# Patient Record
Sex: Female | Born: 1935 | Race: White | Hispanic: No | Smoking: Never smoker
Health system: Southern US, Community
[De-identification: ages and names within clinical notes are randomized; demographics above are authoritative.]

## PROBLEM LIST (undated history)

## (undated) DIAGNOSIS — F0390 Unspecified dementia without behavioral disturbance: Secondary | ICD-10-CM

## (undated) DIAGNOSIS — F329 Major depressive disorder, single episode, unspecified: Secondary | ICD-10-CM

## (undated) DIAGNOSIS — E079 Disorder of thyroid, unspecified: Secondary | ICD-10-CM

## (undated) DIAGNOSIS — K219 Gastro-esophageal reflux disease without esophagitis: Secondary | ICD-10-CM

## (undated) DIAGNOSIS — F32A Depression, unspecified: Secondary | ICD-10-CM

## (undated) DIAGNOSIS — K5900 Constipation, unspecified: Secondary | ICD-10-CM

---

## 1978-04-14 HISTORY — PX: ABDOMINAL HYSTERECTOMY: SHX81

## 1988-04-14 HISTORY — PX: CHOLECYSTECTOMY: SHX55

## 2004-03-29 ENCOUNTER — Emergency Department: Payer: Self-pay | Admitting: Emergency Medicine

## 2004-03-29 ENCOUNTER — Ambulatory Visit: Payer: Self-pay | Admitting: Internal Medicine

## 2004-05-02 ENCOUNTER — Ambulatory Visit: Payer: Self-pay | Admitting: Gastroenterology

## 2004-05-20 ENCOUNTER — Ambulatory Visit: Payer: Self-pay | Admitting: Gastroenterology

## 2004-07-08 ENCOUNTER — Ambulatory Visit: Payer: Self-pay | Admitting: Internal Medicine

## 2004-12-17 ENCOUNTER — Ambulatory Visit: Payer: Self-pay | Admitting: Internal Medicine

## 2005-09-16 ENCOUNTER — Ambulatory Visit: Payer: Self-pay | Admitting: Gastroenterology

## 2005-10-21 ENCOUNTER — Ambulatory Visit: Payer: Self-pay

## 2005-12-01 ENCOUNTER — Ambulatory Visit: Payer: Self-pay

## 2005-12-13 ENCOUNTER — Ambulatory Visit: Payer: Self-pay

## 2006-01-12 ENCOUNTER — Ambulatory Visit: Payer: Self-pay

## 2006-02-12 ENCOUNTER — Ambulatory Visit: Payer: Self-pay

## 2006-03-10 ENCOUNTER — Ambulatory Visit: Payer: Self-pay | Admitting: Internal Medicine

## 2006-04-23 ENCOUNTER — Ambulatory Visit: Payer: Self-pay | Admitting: Psychiatry

## 2007-06-02 ENCOUNTER — Ambulatory Visit: Payer: Self-pay | Admitting: Internal Medicine

## 2007-11-03 DIAGNOSIS — E7800 Pure hypercholesterolemia, unspecified: Secondary | ICD-10-CM

## 2007-11-03 DIAGNOSIS — E039 Hypothyroidism, unspecified: Secondary | ICD-10-CM

## 2007-11-03 DIAGNOSIS — F329 Major depressive disorder, single episode, unspecified: Secondary | ICD-10-CM

## 2007-11-03 DIAGNOSIS — K219 Gastro-esophageal reflux disease without esophagitis: Secondary | ICD-10-CM

## 2008-01-19 DIAGNOSIS — G4700 Insomnia, unspecified: Secondary | ICD-10-CM

## 2008-01-21 ENCOUNTER — Ambulatory Visit: Payer: Self-pay | Admitting: Family Medicine

## 2008-01-28 ENCOUNTER — Emergency Department: Payer: Self-pay | Admitting: Emergency Medicine

## 2008-08-10 ENCOUNTER — Ambulatory Visit: Payer: Self-pay | Admitting: Family Medicine

## 2008-10-02 DIAGNOSIS — E539 Vitamin B deficiency, unspecified: Secondary | ICD-10-CM

## 2009-01-31 DIAGNOSIS — E559 Vitamin D deficiency, unspecified: Secondary | ICD-10-CM

## 2009-08-14 ENCOUNTER — Ambulatory Visit: Payer: Self-pay | Admitting: Family Medicine

## 2010-09-12 ENCOUNTER — Ambulatory Visit: Payer: Self-pay | Admitting: Family Medicine

## 2013-09-01 LAB — URINALYSIS, COMPLETE
BILIRUBIN, UR: NEGATIVE
BLOOD: NEGATIVE
Bacteria: NONE SEEN
GLUCOSE, UR: NEGATIVE mg/dL (ref 0–75)
Ketone: NEGATIVE
LEUKOCYTE ESTERASE: NEGATIVE
Nitrite: NEGATIVE
PH: 7 (ref 4.5–8.0)
PROTEIN: NEGATIVE
SQUAMOUS EPITHELIAL: NONE SEEN
Specific Gravity: 1.011 (ref 1.003–1.030)
WBC UR: <1 /HPF (ref 0–5)

## 2013-09-01 LAB — COMPREHENSIVE METABOLIC PANEL
ALBUMIN: 3.4 g/dL (ref 3.4–5.0)
Alkaline Phosphatase: 68 U/L
Anion Gap: 9 (ref 7–16)
BUN: 10 mg/dL (ref 7–18)
Bilirubin,Total: 0.7 mg/dL (ref 0.2–1.0)
CALCIUM: 8.6 mg/dL (ref 8.5–10.1)
CO2: 27 mmol/L (ref 21–32)
Chloride: 103 mmol/L (ref 98–107)
Creatinine: 0.71 mg/dL (ref 0.60–1.30)
EGFR (Non-African Amer.): >60
Glucose: 108 mg/dL — ABNORMAL HIGH (ref 65–99)
OSMOLALITY: 277 (ref 275–301)
Potassium: 4.2 mmol/L (ref 3.5–5.1)
SGOT(AST): 39 U/L — ABNORMAL HIGH (ref 15–37)
SGPT (ALT): 38 U/L (ref 12–78)
SODIUM: 139 mmol/L (ref 136–145)
TOTAL PROTEIN: 6.8 g/dL (ref 6.4–8.2)

## 2013-09-01 LAB — DRUG SCREEN, URINE
AMPHETAMINES, UR SCREEN: NEGATIVE (ref ?–1000)
BENZODIAZEPINE, UR SCRN: NEGATIVE (ref ?–200)
Barbiturates, Ur Screen: NEGATIVE (ref ?–200)
CANNABINOID 50 NG, UR ~~LOC~~: NEGATIVE (ref ?–50)
COCAINE METABOLITE, UR ~~LOC~~: NEGATIVE (ref ?–300)
MDMA (Ecstasy)Ur Screen: NEGATIVE (ref ?–500)
METHADONE, UR SCREEN: NEGATIVE (ref ?–300)
OPIATE, UR SCREEN: NEGATIVE (ref ?–300)
Phencyclidine (PCP) Ur S: NEGATIVE (ref ?–25)
TRICYCLIC, UR SCREEN: NEGATIVE (ref ?–1000)

## 2013-09-01 LAB — ETHANOL: Ethanol %: <0.003 % (ref 0.000–0.080)

## 2013-09-01 LAB — CBC
HCT: 38.7 % (ref 35.0–47.0)
HGB: 12.9 g/dL (ref 12.0–16.0)
MCH: 32.7 pg (ref 26.0–34.0)
MCHC: 33.4 g/dL (ref 32.0–36.0)
MCV: 98 fL (ref 80–100)
Platelet: 184 10*3/uL (ref 150–440)
RBC: 3.94 10*6/uL (ref 3.80–5.20)
RDW: 12.9 % (ref 11.5–14.5)
WBC: 12.6 10*3/uL — ABNORMAL HIGH (ref 3.6–11.0)

## 2013-09-01 LAB — TROPONIN I: Troponin-I: <0.02 ng/mL

## 2013-09-02 LAB — COMPREHENSIVE METABOLIC PANEL
ALK PHOS: 57 U/L
ALT: 37 U/L (ref 12–78)
AST: 40 U/L — AB (ref 15–37)
Albumin: 3 g/dL — ABNORMAL LOW (ref 3.4–5.0)
Anion Gap: 7 (ref 7–16)
BUN: 13 mg/dL (ref 7–18)
Bilirubin,Total: 1 mg/dL (ref 0.2–1.0)
Calcium, Total: 8.5 mg/dL (ref 8.5–10.1)
Chloride: 104 mmol/L (ref 98–107)
Co2: 27 mmol/L (ref 21–32)
Creatinine: 0.79 mg/dL (ref 0.60–1.30)
EGFR (Non-African Amer.): >60
Glucose: 119 mg/dL — ABNORMAL HIGH (ref 65–99)
OSMOLALITY: 277 (ref 275–301)
Potassium: 3.9 mmol/L (ref 3.5–5.1)
Sodium: 138 mmol/L (ref 136–145)
Total Protein: 6.2 g/dL — ABNORMAL LOW (ref 6.4–8.2)

## 2013-09-02 LAB — CBC WITH DIFFERENTIAL/PLATELET
Basophil #: 0.1 10*3/uL (ref 0.0–0.1)
Basophil %: 0.5 %
EOS ABS: 0.3 10*3/uL (ref 0.0–0.7)
Eosinophil %: 2.4 %
HCT: 36.4 % (ref 35.0–47.0)
HGB: 11.7 g/dL — ABNORMAL LOW (ref 12.0–16.0)
Lymphocyte #: 1.5 10*3/uL (ref 1.0–3.6)
Lymphocyte %: 11.7 %
MCH: 31.5 pg (ref 26.0–34.0)
MCHC: 32.2 g/dL (ref 32.0–36.0)
MCV: 98 fL (ref 80–100)
MONOS PCT: 6 %
Monocyte #: 0.7 x10 3/mm (ref 0.2–0.9)
Neutrophil #: 9.9 10*3/uL — ABNORMAL HIGH (ref 1.4–6.5)
Neutrophil %: 79.4 %
PLATELETS: 172 10*3/uL (ref 150–440)
RBC: 3.72 10*6/uL — ABNORMAL LOW (ref 3.80–5.20)
RDW: 12.9 % (ref 11.5–14.5)
WBC: 12.5 10*3/uL — ABNORMAL HIGH (ref 3.6–11.0)

## 2013-09-02 LAB — PROTIME-INR
INR: 1.1
Prothrombin Time: 14.2 secs (ref 11.5–14.7)

## 2013-09-02 LAB — APTT: Activated PTT: 32.6 secs (ref 23.6–35.9)

## 2013-09-02 LAB — AMYLASE: AMYLASE: 44 U/L (ref 25–115)

## 2013-09-03 ENCOUNTER — Inpatient Hospital Stay: Payer: Self-pay | Admitting: Surgery
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
## 2014-10-10 ENCOUNTER — Other Ambulatory Visit: Payer: Self-pay

## 2014-10-10 DIAGNOSIS — E538 Deficiency of other specified B group vitamins: Secondary | ICD-10-CM | POA: Insufficient documentation

## 2014-10-10 DIAGNOSIS — R7309 Other abnormal glucose: Secondary | ICD-10-CM | POA: Insufficient documentation

## 2014-10-20 ENCOUNTER — Other Ambulatory Visit: Payer: Self-pay | Admitting: Family Medicine

## 2014-10-20 DIAGNOSIS — K219 Gastro-esophageal reflux disease without esophagitis: Secondary | ICD-10-CM

## 2014-10-20 DIAGNOSIS — G4700 Insomnia, unspecified: Secondary | ICD-10-CM

## 2014-10-30 ENCOUNTER — Ambulatory Visit: Payer: Self-pay | Admitting: Family Medicine

## 2014-11-01 ENCOUNTER — Encounter: Payer: Self-pay | Admitting: Urgent Care

## 2014-11-01 ENCOUNTER — Emergency Department
Admission: EM | Admit: 2014-11-01 | Discharge: 2014-11-01 | Disposition: A | Discharge status: Home / Self Care | Payer: Medicare Other | Attending: Emergency Medicine | Admitting: Emergency Medicine

## 2014-11-01 ENCOUNTER — Emergency Department: Payer: Medicare Other

## 2014-11-01 DIAGNOSIS — Y998 Other external cause status: Secondary | ICD-10-CM | POA: Diagnosis not present

## 2014-11-01 DIAGNOSIS — S300XXA Contusion of lower back and pelvis, initial encounter: Secondary | ICD-10-CM | POA: Diagnosis not present

## 2014-11-01 DIAGNOSIS — Y92009 Unspecified place in unspecified non-institutional (private) residence as the place of occurrence of the external cause: Secondary | ICD-10-CM | POA: Insufficient documentation

## 2014-11-01 DIAGNOSIS — W010XXA Fall on same level from slipping, tripping and stumbling without subsequent striking against object, initial encounter: Secondary | ICD-10-CM | POA: Insufficient documentation

## 2014-11-01 DIAGNOSIS — W19XXXA Unspecified fall, initial encounter: Secondary | ICD-10-CM

## 2014-11-01 DIAGNOSIS — Y9389 Activity, other specified: Secondary | ICD-10-CM | POA: Insufficient documentation

## 2014-11-01 DIAGNOSIS — Z79899 Other long term (current) drug therapy: Secondary | ICD-10-CM | POA: Insufficient documentation

## 2014-11-01 DIAGNOSIS — S3992XA Unspecified injury of lower back, initial encounter: Secondary | ICD-10-CM | POA: Diagnosis present

## 2014-11-01 HISTORY — DX: Gastro-esophageal reflux disease without esophagitis: K21.9

## 2014-11-01 HISTORY — DX: Depression, unspecified: F32.A

## 2014-11-01 HISTORY — DX: Disorder of thyroid, unspecified: E07.9

## 2014-11-01 HISTORY — DX: Major depressive disorder, single episode, unspecified: F32.9

## 2014-11-01 HISTORY — DX: Constipation, unspecified: K59.00

## 2014-11-01 MED ORDER — TRAMADOL HCL 50 MG PO TABS
100.0000 mg | ORAL_TABLET | Freq: Once | ORAL | Status: AC
  Administered 2014-11-01: 100 mg via ORAL
  Filled 2014-11-01: qty 2

## 2014-11-01 MED ORDER — TRAMADOL HCL 50 MG PO TABS: 50.0000 mg | ORAL_TABLET | Freq: Two times a day (BID) | ORAL | Status: DC

## 2014-11-01 NOTE — ED Notes (Addendum)
Pt presents to ED with c/o bilateral hip pain d/t a fall on Monday of this week. Pt denies any radiation of pain into lower extremities; pt does reports some weakness bilaterally below the knees, but attributes that to "old age".

## 2014-11-01 NOTE — ED Provider Notes (Signed)
[facility] Emergency Department Provider Note ____________________________________________  Time seen: 2020  I have reviewed the triage vital signs and the nursing notes.  HISTORY  Chief Complaint  Fall and Back Pain  HPI Virginia Osborne is a 81 y.o. female with a witnessed mechanical fall, at home on Monday. She describes she was stepping to the threshold and her door, when the heel of her shoe got caught and broke, causingher to fall backwards on her buttocks. Her adult son was there to witness the fall and was able to pick her up. He denies, as does she, any head injury, loss of consciousness, or nausea. She complains of pain to her buttocks which is worsened with prolonged sitting and transitioning to standing. She denies any leg weakness, foot drop, incontinence, or subsequent fall.   Past Medical History  Diagnosis Date  . Thyroid disease   . GERD (gastroesophageal reflux disease)   . Depression   . Constipation     Patient Active Problem List   Diagnosis Date Noted  . Abnormal blood sugar 10/10/2014  . B12 deficiency 10/10/2014  . Avitaminosis D 01/31/2009  . Deficiency of vitamin B 10/02/2008  . Cannot sleep 01/19/2008  . Clinical depression 11/03/2007  . Acid reflux 11/03/2007  . Hypercholesteremia 11/03/2007  . Adult hypothyroidism 11/03/2007    Past Surgical History  Procedure Laterality Date  . Cholecystectomy  1990  . Abdominal hysterectomy  1980    total    Current Outpatient Rx  Name  Route  Sig  Dispense  Refill  . BIOFLAVONOID PRODUCTS PO   Oral   Take by mouth.         . Cholecalciferol (VITAMIN D) 2000 UNITS CAPS   Oral   Take by mouth.         . cyanocobalamin 1000 MCG tablet   Oral   Take 2 tablets by mouth daily.         . Ibuprofen-Diphenhydramine HCl (ADVIL PM) 200-25 MG CAPS   Oral   Take by mouth.         . levothyroxine (SYNTHROID, LEVOTHROID) 112 MCG tablet   Oral   Take by mouth.         .  MULTIPLE VITAMIN PO               . omeprazole (PRILOSEC) 20 MG capsule      TAKE ONE CAPSULE BY MOUTH ONCE DAILY   30 capsule   5   . omeprazole (PRILOSEC) 20 MG capsule      TAKE ONE CAPSULE BY MOUTH ONCE DAILY   30 capsule   5   . Omeprazole 20 MG TBEC   Oral   Take by mouth.         Marland Kitchen. PARoxetine (PAXIL) 30 MG tablet   Oral   Take by mouth.         Marland Kitchen. POLYETHYLENE GLYCOL 3350 PO   Oral   Take by mouth.         . traMADol (ULTRAM) 50 MG tablet   Oral   Take 1 tablet (50 mg total) by mouth 2 (two) times daily.   15 tablet   0   . traZODone (DESYREL) 150 MG tablet      TAKE TWO TABLETS BY MOUTH IN THE EVENING   60 tablet   5    Allergies Erythromycin  Family History  Problem Relation Age of Onset  . Diabetes Sister    Social History  History  Substance Use Topics  . Smoking status: Never Smoker   . Smokeless tobacco: Not on file  . Alcohol Use: Yes   Review of Systems  Constitutional: Negative for fever. Eyes: Negative for visual changes. ENT: Negative for sore throat. Cardiovascular: Negative for chest pain. Respiratory: Negative for shortness of breath. Gastrointestinal: Negative for abdominal pain, vomiting and diarrhea. Genitourinary: Negative for dysuria. Musculoskeletal: Positive for back pain. Skin: Negative for rash. Neurological: Negative for headaches, focal weakness or numbness. ____________________________________________  PHYSICAL EXAM:  VITAL SIGNS: ED Triage Vitals  Enc Vitals Group     BP 11/01/14 2004 130/58 mmHg     Pulse Rate 11/01/14 2004 88     Resp 11/01/14 2004 16     Temp 11/01/14 2004 97.2 F (36.2 C)     Temp Source 11/01/14 2004 Oral     SpO2 11/01/14 2004 93 %     Weight 11/01/14 2004 155 lb (70.308 kg)     Height 11/01/14 2004  (1.651 m)     Head Cir --      Peak Flow --      Pain Score 11/01/14 2005 10     Pain Loc --      Pain Edu? --      Excl. in GC? --    Constitutional: Alert and  oriented. Well appearing and in no distress. Eyes: Conjunctivae are normal. PERRL. Normal extraocular movements. ENT   Head: Normocephalic and atraumatic.   Nose: No congestion/rhinnorhea.   Mouth/Throat: Mucous membranes are moist.   Neck: Supple. No thyromegaly. Hematological/Lymphatic/Immunilogical: No cervical lymphadenopathy. Cardiovascular: Normal rate, regular rhythm.  Respiratory: Normal respiratory effort. No wheezes/rales/rhonchi. Gastrointestinal: Soft and nontender. No distention. Musculoskeletal: Normal spinal alignment without spasm, deformity, or step-off. No coccyx tenderness. Minimal tenderness to palp over the bilateral gluteal musculature.  Nontender with normal range of motion in all extremities. Normal sit-stand transition. Normal hip flexion. Neurologic:  Normal gait without ataxia. Normal speech and language. No gross focal neurologic deficits are appreciated. Normal LE DTRs bilaterally. Negative SLR bilaterally. Normal toe dorsiflexion.  Skin:  Skin is warm, dry and intact. No rash noted. CNII-XII grossly intact. Psychiatric: Mood and affect are normal. Patient exhibits appropriate insight and judgment. _____________________________   RADIOLOGY LS Spine FINDINGS: There is no evidence of lumbar spine fracture. Alignment is normal.  Moderate degenerative disc disease seen at L4-5 and L5-S1. Moderate facet DJD also seen at these levels. Generalized osteopenia noted. Atherosclerotic calcification of abdominal aorta noted.  I, Jaciel Diem, Charlesetta Ivory, personally viewed and evaluated these images as part of my medical decision making.  ____________________________________________  PROCEDURES  Tramadol 100 mg PO ____________________________________________  INITIAL IMPRESSION / ASSESSMENT AND PLAN / ED COURSE  Mechanical fall resulting in injury. Radiology results to patient and family. Treatment for lumbosacral contusion without neuromuscular  deficit.  Continue home Aleve BID along with prescription for Tramadol #15.  Follow-up with primary provider for ongoing symptoms.  ____________________________________________  FINAL CLINICAL IMPRESSION(S) / ED DIAGNOSES  Final diagnoses:  Fall at home, initial encounter  Contusion, buttock, initial encounter     Lissa Hoard, PA-C 11/01/14 2157  Phineas Semen, MD 11/02/14 [phone]

## 2014-11-01 NOTE — Discharge Instructions (Signed)
Contusion A contusion is a deep bruise. Contusions happen when an injury causes bleeding under the skin. Signs of bruising include pain, puffiness (swelling), and discolored skin. The contusion may turn blue, purple, or yellow. HOME CARE   Put ice on the injured area.  Put ice in a plastic bag.  Place a towel between your skin and the bag.  Leave the ice on for 15-20 minutes, 03-04 times a day.  Only take medicine as told by your doctor.  Rest the injured area.  If possible, raise (elevate) the injured area to lessen puffiness. GET HELP RIGHT AWAY IF:   You have more bruising or puffiness.  You have pain that is getting worse.  Your puffiness or pain is not helped by medicine. MAKE SURE YOU:   Understand these instructions.  Will watch your condition.  Will get help right away if you are not doing well or get worse. Document Released: 09/17/2007 Document Revised: 06/23/2011 Document Reviewed: 02/03/2011 [facility] Patient Information 2015 [place], [place]. This information is not intended to replace advice given to you by your health care provider. Make sure you discuss any questions you have with your health care provider.  Tailbone Injury The tailbone is the small bone at the lower end of the backbone (spine). You may have stretched tissues, bruises, or a broken bone (fracture). Most tailbone injuries get better on their own after 4 to 6 weeks. HOME CARE  Put ice on the injured area.  Put ice in a plastic bag.  Place a towel between your skin and the bag.  Leave the ice on for 15-20 minutes. Do this every hour while you are awake for 1 to 2 days.  Sit on a large, rubber or inflated ring or cushion to lessen pain. Lean forward when you sit to help lessen pain.  Avoid sitting in one place for a long time.  Increase your activity as the pain allows.  Only take medicines as told by your doctor.  You can take medicine to help you poop (stool softeners) if it is painful  to poop.  Eat foods with plenty of fiber.  Keep all doctor visits as told. GET HELP RIGHT AWAY IF:  Your pain gets worse.  Pooping causes you pain.  You cannot poop (constipation).  You have a fever. MAKE SURE YOU:  Understand these instructions.  Will watch your condition.  Will get help right away if you are not doing well or get worse. Document Released: 05/03/2010 Document Revised: 06/23/2011 Document Reviewed: 10/24/2010 [facility]Exit[facility] Patient Information 2015 [place], [place]. This information is not intended to replace advice given to you by your health care provider. Make sure you discuss any questions you have with your health care provider.  Your exam and x-rays are normal today following your fall at home.  You should continue to take your daily Aleve and the prescription Tramadol as needed.  Apply warm, moist compresses to reduce pain as needed. Follow-up with your provider as needed.

## 2014-11-01 NOTE — ED Notes (Signed)
Patient presents with c/o lower back pain s/p a fall on Monday of this week. Patient reporting that she tripped over her shoes. No other c/o pain at this time.

## 2014-11-05 ENCOUNTER — Emergency Department
Admission: EM | Admit: 2014-11-05 | Discharge: 2014-11-05 | Disposition: A | Discharge status: Home / Self Care | Payer: Medicare Other | Attending: Emergency Medicine | Admitting: Emergency Medicine

## 2014-11-05 ENCOUNTER — Other Ambulatory Visit: Payer: Self-pay

## 2014-11-05 ENCOUNTER — Emergency Department: Payer: Medicare Other

## 2014-11-05 DIAGNOSIS — Z79899 Other long term (current) drug therapy: Secondary | ICD-10-CM | POA: Insufficient documentation

## 2014-11-05 DIAGNOSIS — S20229D Contusion of unspecified back wall of thorax, subsequent encounter: Secondary | ICD-10-CM

## 2014-11-05 DIAGNOSIS — S300XXD Contusion of lower back and pelvis, subsequent encounter: Secondary | ICD-10-CM | POA: Diagnosis not present

## 2014-11-05 DIAGNOSIS — M545 Low back pain, unspecified: Secondary | ICD-10-CM

## 2014-11-05 DIAGNOSIS — W01198D Fall on same level from slipping, tripping and stumbling with subsequent striking against other object, subsequent encounter: Secondary | ICD-10-CM | POA: Insufficient documentation

## 2014-11-05 LAB — URINALYSIS COMPLETE WITH MICROSCOPIC (ARMC ONLY)
Bilirubin Urine: NEGATIVE
GLUCOSE, UA: NEGATIVE mg/dL
Hgb urine dipstick: NEGATIVE
Ketones, ur: NEGATIVE mg/dL
Leukocytes, UA: NEGATIVE
NITRITE: NEGATIVE
PROTEIN: NEGATIVE mg/dL
RBC / HPF: NONE SEEN RBC/hpf (ref 0–5)
SPECIFIC GRAVITY, URINE: 1.008 (ref 1.005–1.030)
pH: 7 (ref 5.0–8.0)

## 2014-11-05 LAB — COMPREHENSIVE METABOLIC PANEL
ALK PHOS: 74 U/L (ref 38–126)
ALT: 13 U/L — ABNORMAL LOW (ref 14–54)
AST: 20 U/L (ref 15–41)
Albumin: 3.4 g/dL — ABNORMAL LOW (ref 3.5–5.0)
Anion gap: 6 (ref 5–15)
BUN: 12 mg/dL (ref 6–20)
CALCIUM: 8.8 mg/dL — AB (ref 8.9–10.3)
CO2: 27 mmol/L (ref 22–32)
CREATININE: 0.82 mg/dL (ref 0.44–1.00)
Chloride: 105 mmol/L (ref 101–111)
GFR calc non Af Amer: >60 mL/min (ref >60)
Glucose, Bld: 109 mg/dL — ABNORMAL HIGH (ref 65–99)
POTASSIUM: 4.2 mmol/L (ref 3.5–5.1)
Sodium: 138 mmol/L (ref 135–145)
TOTAL PROTEIN: 6.6 g/dL (ref 6.5–8.1)
Total Bilirubin: 0.3 mg/dL (ref 0.3–1.2)

## 2014-11-05 LAB — CBC WITH DIFFERENTIAL/PLATELET
BASOS ABS: 0.1 10*3/uL (ref 0–0.1)
Basophils Relative: 1 %
EOS PCT: 2 %
Eosinophils Absolute: 0.2 10*3/uL (ref 0–0.7)
HEMATOCRIT: 37.8 % (ref 35.0–47.0)
HEMOGLOBIN: 12.9 g/dL (ref 12.0–16.0)
Lymphocytes Relative: 15 %
Lymphs Abs: 1.4 10*3/uL (ref 1.0–3.6)
MCH: 32.2 pg (ref 26.0–34.0)
MCHC: 34.1 g/dL (ref 32.0–36.0)
MCV: 94.6 fL (ref 80.0–100.0)
MONO ABS: 0.8 10*3/uL (ref 0.2–0.9)
MONOS PCT: 9 %
NEUTROS ABS: 6.5 10*3/uL (ref 1.4–6.5)
Neutrophils Relative %: 73 %
Platelets: 258 10*3/uL (ref 150–440)
RBC: 4 MIL/uL (ref 3.80–5.20)
RDW: 13 % (ref 11.5–14.5)
WBC: 9 10*3/uL (ref 3.6–11.0)

## 2014-11-05 LAB — TROPONIN I

## 2014-11-05 MED ORDER — TRAMADOL HCL 50 MG PO TABS: 50.0000 mg | ORAL_TABLET | Freq: Four times a day (QID) | ORAL | Status: DC | PRN

## 2014-11-05 MED ORDER — MELOXICAM 7.5 MG PO TABS: 7.5000 mg | ORAL_TABLET | Freq: Every day | ORAL | Status: DC

## 2014-11-05 NOTE — ED Provider Notes (Signed)
ED ECG REPORT I, Yama Nielson, the attending physician, personally viewed and interpreted this ECG.  Date: 11/05/2014 EKG Time: 1730 Rate: 70 Rhythm: normal sinus rhythm QRS Axis: normal Intervals: normal ST/T Wave abnormalities: normal Conduction Disutrbances: none Narrative Interpretation: unremarkable, minimal biphasic T-wave in V2 which is likely of normal without pathological significance   Sharyn Creamer, MD 11/05/14 [phone]

## 2014-11-05 NOTE — ED Notes (Signed)
Patient fell on Monday. Was seen here in ED and x-rays were negative. Patient states she has increased pain. Family states she can only ambulate with walker and assistance now.

## 2014-11-05 NOTE — ED Provider Notes (Signed)
[facility] Emergency Department Provider Note  ____________________________________________  Time seen: 3:26 PM  I have reviewed the triage vital signs and the nursing notes.   HISTORY  Chief Complaint Back Pain   HPI Virginia Osborne is a 81 y.o. female is here with complaint of low back pain. Patient states that she was seen in the emergency room 6 days ago for evaluation of her back. At that time she had a mechanical fallwhen her shoe got caught in the threshold of her door. Patient states that she fell backwards onto her buttocks and is continuing to have pain. The family states that she is unable to get up and down without assistance. Patient states that she takes the tramadol twice a day. Family states that prior to this patient was ambulatory and living by herself. Currently she rates her pain as 6 out of 10. Pain is localized to her sacral area with some pain and bilateral hips. She denies any bowel or bladder incontinence.   Past Medical History  Diagnosis Date  . Thyroid disease   . GERD (gastroesophageal reflux disease)   . Depression   . Constipation     Patient Active Problem List   Diagnosis Date Noted  . Abnormal blood sugar 10/10/2014  . B12 deficiency 10/10/2014  . Avitaminosis D 01/31/2009  . Deficiency of vitamin B 10/02/2008  . Cannot sleep 01/19/2008  . Clinical depression 11/03/2007  . Acid reflux 11/03/2007  . Hypercholesteremia 11/03/2007  . Adult hypothyroidism 11/03/2007    Past Surgical History  Procedure Laterality Date  . Cholecystectomy  1990  . Abdominal hysterectomy  1980    total    Current Outpatient Rx  Name  Route  Sig  Dispense  Refill  . BIOFLAVONOID PRODUCTS PO   Oral   Take by mouth.         . Cholecalciferol (VITAMIN D) 2000 UNITS CAPS   Oral   Take by mouth.         . cyanocobalamin 1000 MCG tablet   Oral   Take 2 tablets by mouth daily.         . Ibuprofen-Diphenhydramine HCl (ADVIL  PM) 200-25 MG CAPS   Oral   Take by mouth.         . levothyroxine (SYNTHROID, LEVOTHROID) 112 MCG tablet   Oral   Take by mouth.         . meloxicam (MOBIC) 7.5 MG tablet   Oral   Take 1 tablet (7.5 mg total) by mouth daily.   5 tablet   2   . MULTIPLE VITAMIN PO               . omeprazole (PRILOSEC) 20 MG capsule      TAKE ONE CAPSULE BY MOUTH ONCE DAILY   30 capsule   5   . omeprazole (PRILOSEC) 20 MG capsule      TAKE ONE CAPSULE BY MOUTH ONCE DAILY   30 capsule   5   . Omeprazole 20 MG TBEC   Oral   Take by mouth.         Marland Kitchen PARoxetine (PAXIL) 30 MG tablet   Oral   Take by mouth.         Marland Kitchen POLYETHYLENE GLYCOL 3350 PO   Oral   Take by mouth.         . traMADol (ULTRAM) 50 MG tablet   Oral   Take 1 tablet (50 mg total) by mouth  every 6 (six) hours as needed.   20 tablet   0   . traZODone (DESYREL) 150 MG tablet      TAKE TWO TABLETS BY MOUTH IN THE EVENING   60 tablet   5     Allergies Erythromycin  Family History  Problem Relation Age of Onset  . Diabetes Sister     Social History History  Substance Use Topics  . Smoking status: Never Smoker   . Smokeless tobacco: Not on file  . Alcohol Use: Yes    Review of Systems Constitutional: No fever/chills Eyes: No visual changes. Cardiovascular: Denies chest pain. Respiratory: Denies shortness of breath. Gastrointestinal: No abdominal pain.  No nausea, no vomiting.   Genitourinary: Negative for dysuria. Musculoskeletal: Positive for back pain. Skin: Negative for rash. Neurological: Negative for headaches, focal weakness or numbness.  10-point ROS otherwise negative.  ____________________________________________   PHYSICAL EXAM:  VITAL SIGNS: ED Triage Vitals  Enc Vitals Group     BP 11/05/14 1427 136/55 mmHg     Pulse Rate 11/05/14 1427 74     Resp 11/05/14 1427 18     Temp 11/05/14 1427 98.4 F (36.9 C)     Temp Source 11/05/14 1427 Oral     SpO2 11/05/14 1427  96 %     Weight 11/05/14 1428 155 lb (70.308 kg)     Height 11/05/14 1428 5\' 5"  (1.651 m)     Head Cir --      Peak Flow --      Pain Score 11/05/14 1428 6     Pain Loc --      Pain Edu? --      Excl. in GC? --     Constitutional: Alert and oriented. Well appearing and in no acute distress. Eyes: Conjunctivae are normal. PERRL. EOMI. Head: Atraumatic. Nose: No congestion/rhinnorhea. Neck: No stridor.  Nontender cervical spine posteriorly Cardiovascular: Normal rate, regular rhythm. Grossly normal heart sounds.  Good peripheral circulation. Respiratory: Normal respiratory effort.  No retractions. Lungs CTAB. Gastrointestinal: Soft and nontender. No distention.  No CVA tenderness. Musculoskeletal: Back exam fails to show any gross abnormality. There is moderate tenderness on palpation of the lower sacral area and paravertebral muscles bilaterally. Range of motion in the hips was minimally restricted secondary to pain. Patient was able to abduct and abduct with minimal pain. There is no shortening of either extremity. There is no ecchymosis or swelling posterior back. No lower extremity tenderness nor edema.  No joint effusions. Nontender knees and ankles. Not edematous. Neurologic:  Normal speech and language. No gross focal neurologic deficits are appreciated. No gait instability. Skin:  Skin is warm, dry and intact. No rash noted. No ecchymosis is noted Psychiatric: Mood and affect are normal. Speech and behavior are normal.  ____________________________________________   LABS (all labs ordered are listed, but only abnormal results are displayed)  Labs Reviewed  COMPREHENSIVE METABOLIC PANEL - Abnormal; Notable for the following:    Glucose, Bld 109 (*)    Calcium 8.8 (*)    Albumin 3.4 (*)    ALT 13 (*)    All other components within normal limits  URINALYSIS COMPLETEWITH MICROSCOPIC ([facility] ONLY) - Abnormal; Notable for the following:    Color, Urine STRAW (*)    APPearance  CLEAR (*)    Bacteria, UA RARE (*)    Squamous Epithelial / LPF 0-5 (*)    All other components within normal limits  CBC WITH DIFFERENTIAL/PLATELET  TROPONIN I  ____________________________________________  EKG  Per Dr. Fanny Bien ____________________________________________  RADIOLOGY  Pelvis per radiologist shows no evidence of pelvic fracture. I, Tommi Rumps, personally viewed and evaluated these images as part of my medical decision making.  ____________________________________________   PROCEDURES  Procedure(s) performed: None  Critical Care performed: No  ____________________________________________   INITIAL IMPRESSION / ASSESSMENT AND PLAN / ED COURSE  Pertinent labs & imaging results that were available during my care of the patient were reviewed by me and considered in my medical decision making (see chart for details  discussed findings with patient and family. We will increase her tramadol to one every 6 hours and may take 7.5 one daily for arthritis pain for 5 days until she can follow-up with her family doctor. Family will consider in-home health care and physical therapy. Family is aware that extremity.can increase her chances of following up due to sedation.   FINAL CLINICAL IMPRESSION(S) / ED DIAGNOSES  Final diagnoses:  Contusion, back, unspecified laterality, subsequent encounter  Acute low back pain      Tommi Rumps, PA-C 11/05/14 1857  Governor Rooks, MD 11/08/14 [phone]

## 2014-11-05 NOTE — ED Notes (Signed)
AAOx3.  Skin warm and dry.  Moving all extremities.  Transferred from stretcher to wheelchair well.  D/c home

## 2014-11-05 NOTE — ED Notes (Signed)
Patient to ED with c/o lower back pain. Patient reports that she fell on Monday and has had pain since that time. Reports being seen here on Wednesday but no breaks were found. Patient reports that pain has not gotten any better and she continues to need assistance with getting up and down.

## 2014-11-05 NOTE — ED Notes (Signed)
Patient OOB to wheelchair.  Patient c/o pain initially when going from lying to sitting and then sitting to standing.  Pain improved once standing.  Daughter at bedside.  Shared concerns r/t caring for patient at home.  To follow up with PCP tomorrow.  Reassured that patient pain should improve with maintaining good movement and mobility.

## 2014-11-05 NOTE — ED Notes (Signed)
Patient placed on bedpan.  Rolled well.  Tolerated will.  Voided without difficulty.  Skin care done.

## 2014-11-05 NOTE — Discharge Instructions (Signed)
CALL YOUR DOCTOR TOMORROW  STOP [facility] AT [facility] FOR INFLAMMATION AND ULTRAM FOR PAIN

## 2014-11-06 ENCOUNTER — Encounter: Payer: Self-pay | Admitting: Family Medicine

## 2014-11-06 ENCOUNTER — Ambulatory Visit (INDEPENDENT_AMBULATORY_CARE_PROVIDER_SITE_OTHER): Payer: Medicare Other | Admitting: Family Medicine

## 2014-11-06 ENCOUNTER — Telehealth: Payer: Self-pay | Admitting: Family Medicine

## 2014-11-06 VITALS — BP 140/68 | HR 80 | Temp 98.1°F | Resp 16 | Wt 155.0 lb

## 2014-11-06 DIAGNOSIS — W19XXXD Unspecified fall, subsequent encounter: Secondary | ICD-10-CM | POA: Diagnosis not present

## 2014-11-06 DIAGNOSIS — R52 Pain, unspecified: Secondary | ICD-10-CM

## 2014-11-06 MED ORDER — HYDROCODONE-ACETAMINOPHEN 5-325 MG PO TABS: 1.0000 | ORAL_TABLET | ORAL | Status: DC | PRN

## 2014-11-06 NOTE — Telephone Encounter (Signed)
Pt has a fall in her home 10/30/14 and has been to the [facility] ER on 11/01/14 and 11/05/14.  Pt is having a lot of pain on her lower back and bottom.  I have scheduled a hospital follow up for today at 4:00/MW

## 2014-11-06 NOTE — Progress Notes (Signed)
Subjective:    Patient ID: Virginia Osborne, female    DOB: Oct 31, 1926, 81 y.o.   MRN: [id number]  Fall The accident occurred 5 to 7 days ago. She landed on concrete. The point of impact was the buttocks. The pain is present in the buttocks, left lower leg and right lower leg. The pain is at a severity of 10/10. The pain is severe. The symptoms are aggravated by ambulation, standing, movement and sitting. Pertinent negatives include no fever, headaches or numbness. She has tried ice, NSAID and heat for the symptoms. The treatment provided no relief.   Patient is widowed and lives by herself.  Does have history of depression. Says she does not want to live with this pain. ULtram not helping. Will kill herself if pain does not stop. Wants to go to rehab. Daughter present for entire encounter.    Patient Active Problem List   Diagnosis Date Noted  . Abnormal blood sugar 10/10/2014  . B12 deficiency 10/10/2014  . Avitaminosis D 01/31/2009  . Deficiency of vitamin B 10/02/2008  . Cannot sleep 01/19/2008  . Clinical depression 11/03/2007  . Acid reflux 11/03/2007  . Hypercholesteremia 11/03/2007  . Adult hypothyroidism 11/03/2007   Family History  Problem Relation Age of Onset  . Diabetes Sister    History   Social History  . Marital Status: Married    Spouse Name: N/A  . Number of Children: N/A  . Years of Education: N/A   Occupational History  . Not on file.   Social History Main Topics  . Smoking status: Never Smoker   . Smokeless tobacco: Never Used  . Alcohol Use: No  . Drug Use: No  . Sexual Activity: Not on file   Other Topics Concern  . Not on file   Social History Narrative   Past Surgical History  Procedure Laterality Date  . Cholecystectomy  1990  . Abdominal hysterectomy  1980    total   Allergies  Allergen Reactions  . Erythromycin    Previous Medications   BIOFLAVONOID PRODUCTS PO    Take by mouth.   CHOLECALCIFEROL (VITAMIN D) 2000 UNITS CAPS     Take by mouth.   CYANOCOBALAMIN 1000 MCG TABLET    Take 2 tablets by mouth daily.   IBUPROFEN-DIPHENHYDRAMINE HCL (ADVIL PM) 200-25 MG CAPS    Take by mouth.   LEVOTHYROXINE (SYNTHROID, LEVOTHROID) 112 MCG TABLET    Take by mouth.   MELOXICAM (MOBIC) 7.5 MG TABLET    Take 1 tablet (7.5 mg total) by mouth daily.   MULTIPLE VITAMIN PO       OMEPRAZOLE (PRILOSEC) 20 MG CAPSULE    TAKE ONE CAPSULE BY MOUTH ONCE DAILY   OMEPRAZOLE 20 MG TBEC    Take by mouth.   PAROXETINE (PAXIL) 30 MG TABLET    Take by mouth.   POLYETHYLENE GLYCOL 3350 PO    Take by mouth.   TRAMADOL (ULTRAM) 50 MG TABLET    Take 1 tablet (50 mg total) by mouth every 6 (six) hours as needed.   TRAZODONE (DESYREL) 150 MG TABLET    TAKE TWO TABLETS BY MOUTH IN THE EVENING     Review of Systems  Constitutional: Positive for activity change and appetite change. Negative for fever, chills, diaphoresis, fatigue and unexpected weight change.  Musculoskeletal: Positive for back pain, arthralgias and gait problem. Negative for myalgias, joint swelling, neck pain and neck stiffness.  Neurological: Negative for dizziness, tremors, seizures, syncope, facial asymmetry,  speech difficulty, weakness, light-headedness, numbness and headaches.       Objective:   Physical Exam  Constitutional: She is oriented to person, place, and time. She appears well-developed and well-nourished.  Musculoskeletal:  Sitting upright in chair. Is able to raise up with assistance, and walk slowly with walker.   Neurological: She is alert and oriented to person, place, and time.  Psychiatric: She has a normal mood and affect.  Agitated today.    BP 140/68 mmHg  Pulse 80  Temp(Src) 98.1 F (36.7 C) (Oral)  Resp 16  Wt 155 lb (70.308 kg)      Assessment & Plan:  1. Fall, subsequent encounter Patient with no pain relief with medication. May have hairline fracture. Patient has been seen in ER twice already. May need more assistance at home. Spoke with  daughter about this. Will try Vicodin as has good response to this in the past when she had her sternum fracture.   If does not improve, will have to go back to hospital for placement.  Will order PT and see if helps.   - HYDROcodone-acetaminophen (NORCO/VICODIN) 5-325 MG per tablet; Take 1 tablet by mouth every 4 (four) hours as needed for moderate pain.  Dispense: 60 tablet; Refill: 0 - Ambulatory referral to Physical Therapy  Lorie Phenix, MD

## 2014-11-19 ENCOUNTER — Other Ambulatory Visit: Payer: Self-pay | Admitting: Family Medicine

## 2014-11-19 DIAGNOSIS — F32A Depression, unspecified: Secondary | ICD-10-CM

## 2014-11-19 DIAGNOSIS — F329 Major depressive disorder, single episode, unspecified: Secondary | ICD-10-CM

## 2014-11-24 MED ORDER — HYDROCODONE-ACETAMINOPHEN 5-325 MG PO TABS: 1.0000 | ORAL_TABLET | Freq: Four times a day (QID) | ORAL | Status: DC | PRN

## 2014-11-24 NOTE — Telephone Encounter (Signed)
Printed. Please notify Virginia Osborne.  Also check and see how patient is doing. Thanks.

## 2014-11-24 NOTE — Telephone Encounter (Signed)
Informed Virginia Osborne rx is ready for PU. States pt is "greatly improved", but it was recommended that pt take the medication 1 hour prior to PT. Virginia Osborne, CMA

## 2014-11-24 NOTE — Telephone Encounter (Signed)
Please check order and put in refill for me to sign. Thanks.

## 2014-11-24 NOTE — Telephone Encounter (Signed)
Daughter, Virginia Osborne, states patient was prescribed Norco and they will run out this weekend. She wanted to see if this could be refilled please. Patient is taking this medication still for pain after her fall every 6 hours. Please review-aa

## 2014-11-28 ENCOUNTER — Telehealth: Payer: Self-pay | Admitting: Family Medicine

## 2014-11-28 NOTE — Telephone Encounter (Signed)
Advised Crystal as directed below.  Thanks,

## 2014-11-28 NOTE — Telephone Encounter (Signed)
Ok to do this 

## 2014-11-28 NOTE — Telephone Encounter (Signed)
See below

## 2014-11-28 NOTE — Telephone Encounter (Signed)
Left message to call back. Will try to call back.  Thanks,

## 2014-11-28 NOTE — Telephone Encounter (Signed)
Crystal at Home health call wanting verbal orders for OT.  Week 1 and week 4 is what she requested.  Her call back is [phone]  Thanks Barth Kirks

## 2015-01-15 ENCOUNTER — Encounter (INDEPENDENT_AMBULATORY_CARE_PROVIDER_SITE_OTHER): Payer: Medicare Other | Admitting: Family Medicine

## 2015-01-15 DIAGNOSIS — M79604 Pain in right leg: Secondary | ICD-10-CM | POA: Diagnosis not present

## 2015-01-15 DIAGNOSIS — M545 Low back pain: Secondary | ICD-10-CM | POA: Diagnosis not present

## 2015-01-15 DIAGNOSIS — M79605 Pain in left leg: Secondary | ICD-10-CM

## 2015-01-15 DIAGNOSIS — F329 Major depressive disorder, single episode, unspecified: Secondary | ICD-10-CM | POA: Diagnosis not present

## 2015-01-22 ENCOUNTER — Other Ambulatory Visit: Payer: Self-pay | Admitting: Family Medicine

## 2015-01-22 DIAGNOSIS — E039 Hypothyroidism, unspecified: Secondary | ICD-10-CM

## 2015-02-05 ENCOUNTER — Encounter (INDEPENDENT_AMBULATORY_CARE_PROVIDER_SITE_OTHER): Payer: Medicare Other | Admitting: Family Medicine

## 2015-02-05 DIAGNOSIS — F329 Major depressive disorder, single episode, unspecified: Secondary | ICD-10-CM

## 2015-02-05 DIAGNOSIS — M79604 Pain in right leg: Secondary | ICD-10-CM | POA: Diagnosis not present

## 2015-02-05 DIAGNOSIS — M79605 Pain in left leg: Secondary | ICD-10-CM | POA: Diagnosis not present

## 2015-02-05 DIAGNOSIS — M545 Low back pain: Secondary | ICD-10-CM

## 2015-02-20 ENCOUNTER — Encounter: Payer: Self-pay | Admitting: Family Medicine

## 2015-02-20 ENCOUNTER — Ambulatory Visit (INDEPENDENT_AMBULATORY_CARE_PROVIDER_SITE_OTHER): Payer: Medicare Other | Admitting: Family Medicine

## 2015-02-20 VITALS — BP 110/72 | HR 88 | Temp 97.8°F | Resp 16 | Ht 65.0 in | Wt 164.0 lb

## 2015-02-20 DIAGNOSIS — R7309 Other abnormal glucose: Secondary | ICD-10-CM

## 2015-02-20 DIAGNOSIS — G4700 Insomnia, unspecified: Secondary | ICD-10-CM | POA: Diagnosis not present

## 2015-02-20 DIAGNOSIS — E7800 Pure hypercholesterolemia, unspecified: Secondary | ICD-10-CM | POA: Diagnosis not present

## 2015-02-20 DIAGNOSIS — E039 Hypothyroidism, unspecified: Secondary | ICD-10-CM | POA: Diagnosis not present

## 2015-02-20 DIAGNOSIS — Z23 Encounter for immunization: Secondary | ICD-10-CM

## 2015-02-20 DIAGNOSIS — Z0000 Encounter for general adult medical examination without abnormal findings: Secondary | ICD-10-CM | POA: Diagnosis not present

## 2015-02-20 MED ORDER — HYDROCODONE-ACETAMINOPHEN 5-325 MG PO TABS: 1.0000 | ORAL_TABLET | Freq: Four times a day (QID) | ORAL | Status: DC | PRN

## 2015-02-20 NOTE — Progress Notes (Signed)
Patient ID: Virginia Osborne, female   DOB: 1927-02-05, 81 y.o.   MRN: [id number]        Patient: Virginia Osborne, Female    DOB: 1927-02-05, 81 y.o.   MRN: [id number] Visit Date: 02/20/2015  Today's Provider: Lorie PhenixNancy Alcus Bradly, MD   Chief Complaint  Patient presents with  . Medicare Wellness  . Insomnia   Subjective:    Annual wellness visit Virginia AdieLouise H Swinford is a 81 y.o. female. She feels fairly well. She reports exercising none. She reports she is sleeping poorly.  02/15/14 CPE 09/12/10 Mammo-BI-RADS 2 02/15/14 EKG  Lab Results  Component Value Date   WBC 9.0 11/05/2014   HGB 12.9 11/05/2014   HCT 37.8 11/05/2014   PLT 258 11/05/2014   GLUCOSE 109* 11/05/2014   ALT 13* 11/05/2014   AST 20 11/05/2014   NA 138 11/05/2014   K 4.2 11/05/2014   CL 105 11/05/2014   CREATININE 0.82 11/05/2014   BUN 12 11/05/2014   CO2 27 11/05/2014   INR 1.1 09/02/2013    -----------------------------------------------------------  Insomnia  She presents today for evaluation of insomnia. Onset was several  years ago. Insomnia is getting unchanged. She has trouble sleeping many times a week.   She has difficulty FALLING asleep. She has difficulty STAYING asleep. She does wake frequently to urinate. She does not have urge to move legs when resting. She is not having pain when trying to sleep  She is having anxiety. She is having a lot of stress in her life. . She is having depression.  She is not taking stimulant medications.  She is not taking new medications:  She is taking OTC sleeping aid. Aleve PM. She is not taking medications to help sleep. She is not drinking alcohol to help sleep. She is not using illicit drugs.  --------------------------------------------------------------------     Review of Systems  Constitutional: Negative.   HENT: Positive for dental problem.   Eyes: Negative.   Respiratory: Negative.   Cardiovascular: Negative.   Gastrointestinal:  Negative.   Endocrine: Negative.   Genitourinary: Negative.   Musculoskeletal: Negative.   Skin: Negative.   Allergic/Immunologic: Negative.   Neurological: Negative.   Hematological: Negative.   Psychiatric/Behavioral: Negative.     Social History   Social History  . Marital Status: Widowed    Spouse Name: N/A  . Number of Children: N/A  . Years of Education: N/A   Occupational History  . Not on file.   Social History Main Topics  . Smoking status: Never Smoker   . Smokeless tobacco: Never Used  . Alcohol Use: No  . Drug Use: No  . Sexual Activity: Not on file   Other Topics Concern  . Not on file   Social History Narrative    Patient Active Problem List   Diagnosis Date Noted  . Abnormal blood sugar 10/10/2014  . B12 deficiency 10/10/2014  . Avitaminosis D 01/31/2009  . Deficiency of vitamin B 10/02/2008  . Cannot sleep 01/19/2008  . Clinical depression 11/03/2007  . Acid reflux 11/03/2007  . Hypercholesteremia 11/03/2007  . Adult hypothyroidism 11/03/2007    Past Surgical History  Procedure Laterality Date  . Cholecystectomy  1990  . Abdominal hysterectomy  1980    total    Her family history includes Diabetes in her sister.    Previous Medications   IBUPROFEN-DIPHENHYDRAMINE HCL (ADVIL PM) 200-25 MG CAPS    Take by mouth.   LEVOTHYROXINE (SYNTHROID, LEVOTHROID) 112 MCG TABLET  TAKE ONE TABLET BY MOUTH ONCE DAILY   OMEPRAZOLE (PRILOSEC) 20 MG CAPSULE    TAKE ONE CAPSULE BY MOUTH ONCE DAILY   PAROXETINE (PAXIL) 30 MG TABLET    TAKE ONE TABLET BY MOUTH AT BEDTIME   POLYETHYLENE GLYCOL 3350 PO    Take by mouth.   TRAZODONE (DESYREL) 150 MG TABLET    TAKE TWO TABLETS BY MOUTH IN THE EVENING    Patient Care Team: Lorie Phenix, MD as PCP - General (Family Medicine)     Objective:   Vitals: BP 110/72 mmHg  Pulse 88  Temp(Src) 97.8 F (36.6 C) (Oral)  Resp 16  Ht  (1.651 m)  Wt 164 lb (74.39 kg)  BMI 27.29 kg/m2  SpO2 94%  Physical  Exam  Constitutional: She is oriented to person, place, and time. She appears well-developed and well-nourished.  HENT:  Head: Normocephalic and atraumatic.  Right Ear: Tympanic membrane, external ear and ear canal normal.  Left Ear: Tympanic membrane, external ear and ear canal normal.  Nose: Nose normal.  Mouth/Throat: Uvula is midline, oropharynx is clear and moist and mucous membranes are normal.  Eyes: Conjunctivae, EOM and lids are normal. Pupils are equal, round, and reactive to light.  Neck: Trachea normal and normal range of motion. Neck supple. Carotid bruit is not present. No thyroid mass and no thyromegaly present.  Cardiovascular: Normal rate, regular rhythm and normal heart sounds.   Pulmonary/Chest: Effort normal and breath sounds normal.  Abdominal: Soft. Normal appearance and bowel sounds are normal. There is no hepatosplenomegaly. There is no tenderness.  Genitourinary: No breast swelling, tenderness or discharge.  Musculoskeletal: Normal range of motion.  Lymphadenopathy:    She has no cervical adenopathy.    She has no axillary adenopathy.  Neurological: She is alert and oriented to person, place, and time. She has normal strength. No cranial nerve deficit.  Skin: Skin is warm, dry and intact.  Psychiatric: She has a normal mood and affect. Her speech is normal and behavior is normal. Judgment and thought content normal. Cognition and memory are normal.    Activities of Daily Living In your present state of health, do you have any difficulty performing the following activities: 02/20/2015  Hearing? N  Vision? N  Difficulty concentrating or making decisions? Y  Walking or climbing stairs? N  Dressing or bathing? N  Doing errands, shopping? N    Fall Risk Assessment Fall Risk  02/20/2015  Falls in the past year? No     Depression Screen PHQ 2/9 Scores 02/20/2015  PHQ - 2 Score 6  PHQ- 9 Score 11    Cognitive Testing - 6-CIT  Correct? Score   What year is  it? yes 0 0 or 4  What month is it? yes 0 0 or 3  Memorize:    Floyde Parkins,  44,  [address],  [place],      What time is it? (within 1 hour) yes 0 0 or 3  Count backwards from 20 yes 0 0, 2, or 4  Name the months of the year yes 1 0, 2, or 4  Repeat name & address above yes 3 0, 2, 4, 6, 8, or 10       TOTAL SCORE  4/28   Interpretation:  Normal  Normal (0-7) Abnormal (8-28)       Assessment & Plan:     Annual Wellness Visit  Reviewed patient's Family Medical History Reviewed and updated list of patient's  medical providers Assessment of cognitive impairment was done Assessed patient's functional ability Established a written schedule for health screening services Health Risk Assessent Completed and Reviewed  Exercise Activities and Dietary recommendations Goals    . Exercise 150 minutes per week (moderate activity)       Immunization History  Administered Date(s) Administered  . Influenza, High Dose Seasonal PF 02/20/2015  . Pneumococcal Conjugate-13 02/15/2014  . Pneumococcal Polysaccharide-23 02/20/2015          1. Medicare annual wellness visit, subsequent Stable.  2. Need for influenza vaccination - Flu vaccine HIGH DOSE PF  3. Need for pneumococcal vaccination - Pneumococcal polysaccharide vaccine 23-valent greater than or equal to 2yo subcutaneous/IM  4. Cannot sleep Worsening. Patient started hydrocodone-acetaminophen 5-325 as below.  - HYDROcodone-acetaminophen (NORCO/VICODIN) 5-325 MG tablet; Take 1 tablet by mouth every 6 (six) hours as needed for moderate pain.  Dispense: 30 tablet; Refill: 0  5. Hypothyroidism, unspecified hypothyroidism type - TSH  6. Abnormal blood sugar - Hemoglobin A1c  7. Hypercholesteremia - Lipid Panel With LDL/HDL Ratio     Patient seen and examined by Dr. Leo Grosser, and note scribed by Liz Beach. Dimas,  CMA.   ------------------------------------------------------------------------------------------------------------

## 2015-04-23 ENCOUNTER — Other Ambulatory Visit: Payer: Self-pay | Admitting: Family Medicine

## 2015-05-07 SURGERY — INSERTION, INTRAMEDULLARY ROD, FEMUR
Anesthesia: General | Laterality: Left

## 2015-10-27 ENCOUNTER — Other Ambulatory Visit: Payer: Self-pay | Admitting: Family Medicine

## 2015-10-27 DIAGNOSIS — G4700 Insomnia, unspecified: Secondary | ICD-10-CM

## 2015-11-15 ENCOUNTER — Inpatient Hospital Stay: Payer: Medicare Other | Admitting: Anesthesiology

## 2015-11-15 ENCOUNTER — Emergency Department: Payer: Medicare Other

## 2015-11-15 ENCOUNTER — Encounter
Admission: EM | Disposition: A | Discharge status: Skilled Nursing Facility | Payer: Self-pay | Source: Home / Self Care | Attending: Internal Medicine

## 2015-11-15 ENCOUNTER — Inpatient Hospital Stay: Payer: Medicare Other

## 2015-11-15 ENCOUNTER — Inpatient Hospital Stay
Admission: EM | Admit: 2015-11-15 | Discharge: 2015-11-18 | DRG: 480 | Disposition: A | Discharge status: Skilled Nursing Facility | Payer: Medicare Other | Attending: Internal Medicine | Admitting: Internal Medicine

## 2015-11-15 ENCOUNTER — Encounter: Payer: Self-pay | Admitting: Emergency Medicine

## 2015-11-15 DIAGNOSIS — W010XXA Fall on same level from slipping, tripping and stumbling without subsequent striking against object, initial encounter: Secondary | ICD-10-CM | POA: Diagnosis present

## 2015-11-15 DIAGNOSIS — S72001A Fracture of unspecified part of neck of right femur, initial encounter for closed fracture: Secondary | ICD-10-CM

## 2015-11-15 DIAGNOSIS — F329 Major depressive disorder, single episode, unspecified: Secondary | ICD-10-CM | POA: Diagnosis present

## 2015-11-15 DIAGNOSIS — J189 Pneumonia, unspecified organism: Secondary | ICD-10-CM | POA: Diagnosis present

## 2015-11-15 DIAGNOSIS — Z833 Family history of diabetes mellitus: Secondary | ICD-10-CM | POA: Diagnosis not present

## 2015-11-15 DIAGNOSIS — S72009A Fracture of unspecified part of neck of unspecified femur, initial encounter for closed fracture: Secondary | ICD-10-CM | POA: Diagnosis present

## 2015-11-15 DIAGNOSIS — R296 Repeated falls: Secondary | ICD-10-CM | POA: Diagnosis present

## 2015-11-15 DIAGNOSIS — Z79899 Other long term (current) drug therapy: Secondary | ICD-10-CM | POA: Diagnosis not present

## 2015-11-15 DIAGNOSIS — S72141A Displaced intertrochanteric fracture of right femur, initial encounter for closed fracture: Principal | ICD-10-CM | POA: Diagnosis present

## 2015-11-15 DIAGNOSIS — F0390 Unspecified dementia without behavioral disturbance: Secondary | ICD-10-CM | POA: Diagnosis present

## 2015-11-15 DIAGNOSIS — S0083XA Contusion of other part of head, initial encounter: Secondary | ICD-10-CM | POA: Diagnosis present

## 2015-11-15 DIAGNOSIS — S80211A Abrasion, right knee, initial encounter: Secondary | ICD-10-CM | POA: Diagnosis present

## 2015-11-15 DIAGNOSIS — R4182 Altered mental status, unspecified: Secondary | ICD-10-CM

## 2015-11-15 DIAGNOSIS — E079 Disorder of thyroid, unspecified: Secondary | ICD-10-CM | POA: Diagnosis present

## 2015-11-15 DIAGNOSIS — T148XXA Other injury of unspecified body region, initial encounter: Secondary | ICD-10-CM

## 2015-11-15 DIAGNOSIS — T07XXXA Unspecified multiple injuries, initial encounter: Secondary | ICD-10-CM

## 2015-11-15 DIAGNOSIS — K219 Gastro-esophageal reflux disease without esophagitis: Secondary | ICD-10-CM | POA: Diagnosis present

## 2015-11-15 DIAGNOSIS — Y92009 Unspecified place in unspecified non-institutional (private) residence as the place of occurrence of the external cause: Secondary | ICD-10-CM

## 2015-11-15 DIAGNOSIS — T148 Other injury of unspecified body region: Secondary | ICD-10-CM | POA: Diagnosis present

## 2015-11-15 HISTORY — DX: Unspecified dementia, unspecified severity, without behavioral disturbance, psychotic disturbance, mood disturbance, and anxiety: F03.90

## 2015-11-15 HISTORY — PX: INTRAMEDULLARY (IM) NAIL INTERTROCHANTERIC: SHX5875

## 2015-11-15 LAB — URINALYSIS COMPLETE WITH MICROSCOPIC (ARMC ONLY)
BACTERIA UA: NONE SEEN
Bilirubin Urine: NEGATIVE
Glucose, UA: NEGATIVE mg/dL
LEUKOCYTES UA: NEGATIVE
Nitrite: NEGATIVE
PH: 7 (ref 5.0–8.0)
PROTEIN: NEGATIVE mg/dL
Specific Gravity, Urine: 1.005 (ref 1.005–1.030)

## 2015-11-15 LAB — BASIC METABOLIC PANEL
ANION GAP: 8 (ref 5–15)
BUN: 13 mg/dL (ref 6–20)
CHLORIDE: 101 mmol/L (ref 101–111)
CO2: 26 mmol/L (ref 22–32)
Calcium: 9.4 mg/dL (ref 8.9–10.3)
Creatinine, Ser: 0.76 mg/dL (ref 0.44–1.00)
GFR calc Af Amer: >60 mL/min (ref >60)
GLUCOSE: 153 mg/dL — AB (ref 65–99)
POTASSIUM: 3.9 mmol/L (ref 3.5–5.1)
Sodium: 135 mmol/L (ref 135–145)

## 2015-11-15 LAB — LACTIC ACID, PLASMA: LACTIC ACID, VENOUS: 1 mmol/L (ref 0.5–1.9)

## 2015-11-15 LAB — CBC WITH DIFFERENTIAL/PLATELET
BASOS ABS: 0.1 10*3/uL (ref 0–0.1)
Basophils Relative: 1 %
EOS PCT: 1 %
Eosinophils Absolute: 0.1 10*3/uL (ref 0–0.7)
HEMATOCRIT: 44.4 % (ref 35.0–47.0)
Hemoglobin: 15.5 g/dL (ref 12.0–16.0)
LYMPHS ABS: 0.4 10*3/uL — AB (ref 1.0–3.6)
LYMPHS PCT: 4 %
MCH: 32.7 pg (ref 26.0–34.0)
MCHC: 34.8 g/dL (ref 32.0–36.0)
MCV: 93.8 fL (ref 80.0–100.0)
Monocytes Absolute: 0.4 10*3/uL (ref 0.2–0.9)
Monocytes Relative: 3 %
NEUTROS ABS: 10.5 10*3/uL — AB (ref 1.4–6.5)
Neutrophils Relative %: 91 %
Platelets: 216 10*3/uL (ref 150–440)
RBC: 4.73 MIL/uL (ref 3.80–5.20)
RDW: 13.2 % (ref 11.5–14.5)
WBC: 11.5 10*3/uL — AB (ref 3.6–11.0)

## 2015-11-15 LAB — GLUCOSE, CAPILLARY: GLUCOSE-CAPILLARY: 122 mg/dL — AB (ref 65–99)

## 2015-11-15 SURGERY — FIXATION, FRACTURE, INTERTROCHANTERIC, WITH INTRAMEDULLARY ROD
Anesthesia: Regional | Site: Hip | Laterality: Right | Wound class: Clean

## 2015-11-15 MED ORDER — ONDANSETRON HCL 4 MG/2ML IJ SOLN: 4.0000 mg | Freq: Four times a day (QID) | INTRAMUSCULAR | Status: DC | PRN

## 2015-11-15 MED ORDER — HYDROCODONE-ACETAMINOPHEN 5-325 MG PO TABS: 1.0000 | ORAL_TABLET | ORAL | Status: DC | PRN

## 2015-11-15 MED ORDER — CEFAZOLIN SODIUM-DEXTROSE 2-4 GM/100ML-% IV SOLN
2.0000 g | INTRAVENOUS | Status: AC
  Administered 2015-11-15: 2 g via INTRAVENOUS
  Filled 2015-11-15: qty 100

## 2015-11-15 MED ORDER — DOCUSATE SODIUM 100 MG PO CAPS: 100.0000 mg | ORAL_CAPSULE | Freq: Two times a day (BID) | ORAL | Status: DC

## 2015-11-15 MED ORDER — HYDROMORPHONE HCL 1 MG/ML IJ SOLN: 0.5000 mg | INTRAMUSCULAR | Status: DC | PRN

## 2015-11-15 MED ORDER — ACETAMINOPHEN 325 MG PO TABS
650.0000 mg | ORAL_TABLET | Freq: Four times a day (QID) | ORAL | Status: DC | PRN
Start: 2015-11-15 — End: 2015-11-18
  Filled 2015-11-15: qty 2

## 2015-11-15 MED ORDER — SODIUM CHLORIDE 0.9 % IV SOLN
INTRAVENOUS | Status: DC
  Administered 2015-11-15: 18:00:00 via INTRAVENOUS

## 2015-11-15 MED ORDER — PAROXETINE HCL 30 MG PO TABS
30.0000 mg | ORAL_TABLET | Freq: Every day | ORAL | Status: DC
  Administered 2015-11-15 – 2015-11-17 (×3): 30 mg via ORAL
  Filled 2015-11-15 (×3): qty 1

## 2015-11-15 MED ORDER — ENOXAPARIN SODIUM 40 MG/0.4ML ~~LOC~~ SOLN
40.0000 mg | SUBCUTANEOUS | Status: DC
  Administered 2015-11-16 – 2015-11-18 (×3): 40 mg via SUBCUTANEOUS
  Filled 2015-11-15 (×3): qty 0.4

## 2015-11-15 MED ORDER — CEFAZOLIN IN D5W 1 GM/50ML IV SOLN
INTRAVENOUS | Status: AC
  Filled 2015-11-15: qty 50

## 2015-11-15 MED ORDER — CEFAZOLIN SODIUM 1 G IJ SOLR
INTRAMUSCULAR | Status: AC
  Filled 2015-11-15: qty 10

## 2015-11-15 MED ORDER — LIDOCAINE HCL (CARDIAC) 20 MG/ML IV SOLN
INTRAVENOUS | Status: DC | PRN
  Administered 2015-11-15: 60 mg via INTRAVENOUS

## 2015-11-15 MED ORDER — POLYETHYLENE GLYCOL 3350 17 G PO PACK: 17.0000 g | PACK | Freq: Every day | ORAL | Status: DC | PRN

## 2015-11-15 MED ORDER — BUPIVACAINE-EPINEPHRINE (PF) 0.5% -1:200000 IJ SOLN
INTRAMUSCULAR | Status: DC | PRN
  Administered 2015-11-15: 30 mL via PERINEURAL

## 2015-11-15 MED ORDER — CEFAZOLIN SODIUM-DEXTROSE 2-4 GM/100ML-% IV SOLN
2.0000 g | Freq: Four times a day (QID) | INTRAVENOUS | Status: AC
  Administered 2015-11-16 (×3): 2 g via INTRAVENOUS
  Filled 2015-11-15 (×3): qty 100

## 2015-11-15 MED ORDER — METOCLOPRAMIDE HCL 5 MG/ML IJ SOLN: 5.0000 mg | Freq: Three times a day (TID) | INTRAMUSCULAR | Status: DC | PRN

## 2015-11-15 MED ORDER — NEOMYCIN-POLYMYXIN B GU 40-200000 IR SOLN
Status: AC
  Filled 2015-11-15: qty 2

## 2015-11-15 MED ORDER — POLYETHYLENE GLYCOL 3350 17 G PO PACK
1.0000 | PACK | Freq: Every day | ORAL | Status: DC
  Administered 2015-11-16 – 2015-11-18 (×3): 17 g via ORAL
  Filled 2015-11-15 (×3): qty 1

## 2015-11-15 MED ORDER — LEVOTHYROXINE SODIUM 112 MCG PO TABS
112.0000 ug | ORAL_TABLET | Freq: Every day | ORAL | Status: DC
  Administered 2015-11-17 – 2015-11-18 (×2): 112 ug via ORAL
  Filled 2015-11-15 (×3): qty 1

## 2015-11-15 MED ORDER — ACETAMINOPHEN 10 MG/ML IV SOLN
INTRAVENOUS | Status: AC
  Filled 2015-11-15: qty 100

## 2015-11-15 MED ORDER — ACETAMINOPHEN 650 MG RE SUPP: 650.0000 mg | Freq: Four times a day (QID) | RECTAL | Status: DC | PRN

## 2015-11-15 MED ORDER — NEOMYCIN-POLYMYXIN B GU 40-200000 IR SOLN
Status: DC | PRN
  Administered 2015-11-15: 2 mL

## 2015-11-15 MED ORDER — HYDROMORPHONE HCL 1 MG/ML IJ SOLN
INTRAMUSCULAR | Status: DC | PRN
  Administered 2015-11-15 (×2): 0.5 mg via INTRAVENOUS

## 2015-11-15 MED ORDER — ONDANSETRON HCL 4 MG/2ML IJ SOLN: 4.0000 mg | Freq: Once | INTRAMUSCULAR | Status: DC | PRN

## 2015-11-15 MED ORDER — DIPHENHYDRAMINE HCL 12.5 MG/5ML PO ELIX
12.5000 mg | ORAL_SOLUTION | ORAL | Status: DC | PRN
Start: 2015-11-15 — End: 2015-11-18

## 2015-11-15 MED ORDER — PANTOPRAZOLE SODIUM 40 MG PO TBEC
40.0000 mg | DELAYED_RELEASE_TABLET | Freq: Every day | ORAL | Status: DC
  Administered 2015-11-16 – 2015-11-18 (×3): 40 mg via ORAL
  Filled 2015-11-15 (×3): qty 1

## 2015-11-15 MED ORDER — ACETAMINOPHEN 10 MG/ML IV SOLN
INTRAVENOUS | Status: DC | PRN
  Administered 2015-11-15: 1000 mg via INTRAVENOUS

## 2015-11-15 MED ORDER — ACETAMINOPHEN 650 MG RE SUPP
650.0000 mg | Freq: Four times a day (QID) | RECTAL | Status: DC | PRN
Start: 2015-11-15 — End: 2015-11-18

## 2015-11-15 MED ORDER — BUPIVACAINE-EPINEPHRINE (PF) 0.5% -1:200000 IJ SOLN
INTRAMUSCULAR | Status: AC
  Filled 2015-11-15: qty 30

## 2015-11-15 MED ORDER — ALBUTEROL SULFATE (2.5 MG/3ML) 0.083% IN NEBU
2.5000 mg | INHALATION_SOLUTION | RESPIRATORY_TRACT | Status: DC | PRN
Start: 2015-11-15 — End: 2015-11-18

## 2015-11-15 MED ORDER — LEVOFLOXACIN IN D5W 750 MG/150ML IV SOLN
750.0000 mg | Freq: Once | INTRAVENOUS | Status: AC
  Administered 2015-11-15: 750 mg via INTRAVENOUS
  Filled 2015-11-15: qty 150

## 2015-11-15 MED ORDER — LACTATED RINGERS IV SOLN
INTRAVENOUS | Status: DC | PRN
  Administered 2015-11-15: 20:00:00 via INTRAVENOUS

## 2015-11-15 MED ORDER — DOCUSATE SODIUM 100 MG PO CAPS
100.0000 mg | ORAL_CAPSULE | Freq: Two times a day (BID) | ORAL | Status: DC
  Administered 2015-11-15 – 2015-11-18 (×6): 100 mg via ORAL
  Filled 2015-11-15 (×6): qty 1

## 2015-11-15 MED ORDER — FLEET ENEMA 7-19 GM/118ML RE ENEM: 1.0000 | ENEMA | Freq: Once | RECTAL | Status: DC | PRN

## 2015-11-15 MED ORDER — PROPOFOL 10 MG/ML IV BOLUS
INTRAVENOUS | Status: DC | PRN
  Administered 2015-11-15: 100 mg via INTRAVENOUS

## 2015-11-15 MED ORDER — OXYCODONE HCL 5 MG PO TABS
5.0000 mg | ORAL_TABLET | ORAL | Status: DC | PRN
  Administered 2015-11-16 – 2015-11-18 (×9): 5 mg via ORAL
  Filled 2015-11-15 (×9): qty 1

## 2015-11-15 MED ORDER — METOCLOPRAMIDE HCL 10 MG PO TABS: 5.0000 mg | ORAL_TABLET | Freq: Three times a day (TID) | ORAL | Status: DC | PRN

## 2015-11-15 MED ORDER — SODIUM CHLORIDE 0.9 % IV BOLUS (SEPSIS)
1000.0000 mL | Freq: Once | INTRAVENOUS | Status: AC
  Administered 2015-11-15: 1000 mL via INTRAVENOUS

## 2015-11-15 MED ORDER — FENTANYL CITRATE (PF) 100 MCG/2ML IJ SOLN
INTRAMUSCULAR | Status: DC | PRN
Start: 2015-11-15 — End: 2015-11-15
  Administered 2015-11-15 (×2): 50 ug via INTRAVENOUS

## 2015-11-15 MED ORDER — ACETAMINOPHEN 325 MG PO TABS: 650.0000 mg | ORAL_TABLET | Freq: Four times a day (QID) | ORAL | Status: DC | PRN

## 2015-11-15 MED ORDER — LEVOFLOXACIN IN D5W 250 MG/50ML IV SOLN
250.0000 mg | INTRAVENOUS | Status: DC
  Filled 2015-11-15: qty 50

## 2015-11-15 MED ORDER — KCL IN DEXTROSE-NACL 20-5-0.9 MEQ/L-%-% IV SOLN
INTRAVENOUS | Status: DC
  Administered 2015-11-16 (×2): via INTRAVENOUS
  Filled 2015-11-15 (×7): qty 1000

## 2015-11-15 MED ORDER — BISACODYL 10 MG RE SUPP: 10.0000 mg | Freq: Every day | RECTAL | Status: DC | PRN

## 2015-11-15 MED ORDER — ONDANSETRON HCL 4 MG/2ML IJ SOLN
INTRAMUSCULAR | Status: DC | PRN
  Administered 2015-11-15: 4 mg via INTRAVENOUS

## 2015-11-15 MED ORDER — TRAZODONE HCL 100 MG PO TABS
300.0000 mg | ORAL_TABLET | Freq: Every evening | ORAL | Status: DC
  Administered 2015-11-16 – 2015-11-17 (×2): 300 mg via ORAL
  Filled 2015-11-15 (×2): qty 3

## 2015-11-15 MED ORDER — ACETAMINOPHEN 500 MG PO TABS
1000.0000 mg | ORAL_TABLET | Freq: Four times a day (QID) | ORAL | Status: AC
  Administered 2015-11-16 (×3): 1000 mg via ORAL
  Filled 2015-11-15 (×3): qty 2

## 2015-11-15 MED ORDER — ONDANSETRON HCL 4 MG PO TABS: 4.0000 mg | ORAL_TABLET | Freq: Four times a day (QID) | ORAL | Status: DC | PRN

## 2015-11-15 MED ORDER — FENTANYL CITRATE (PF) 100 MCG/2ML IJ SOLN: 25.0000 ug | INTRAMUSCULAR | Status: DC | PRN

## 2015-11-15 MED ORDER — MAGNESIUM HYDROXIDE 400 MG/5ML PO SUSP
30.0000 mL | Freq: Every day | ORAL | Status: DC | PRN
  Administered 2015-11-17: 30 mL via ORAL
  Filled 2015-11-15: qty 30

## 2015-11-15 SURGICAL SUPPLY — 39 items
BIT DRILL 4.3MMS DISTAL GRDTED (BIT) ×1 IMPLANT
BNDG COHESIVE 4X5 TAN STRL (GAUZE/BANDAGES/DRESSINGS) ×3 IMPLANT
BNDG COHESIVE 6X5 TAN STRL LF (GAUZE/BANDAGES/DRESSINGS) ×3 IMPLANT
CANISTER SUCT 1200ML W/VALVE (MISCELLANEOUS) ×3 IMPLANT
CHLORAPREP W/TINT 26ML (MISCELLANEOUS) ×6 IMPLANT
DRAPE C-ARMOR (DRAPES) ×3 IMPLANT
DRAPE SHEET LG 3/4 BI-LAMINATE (DRAPES) ×3 IMPLANT
DRILL 4.3MMS DISTAL GRADUATED (BIT) ×3
DRSG OPSITE POSTOP 3X4 (GAUZE/BANDAGES/DRESSINGS) ×9 IMPLANT
DRSG OPSITE POSTOP 4X6 (GAUZE/BANDAGES/DRESSINGS) ×3 IMPLANT
ELECT CAUTERY BLADE 6.4 (BLADE) ×3 IMPLANT
ELECT REM PT RETURN 9FT ADLT (ELECTROSURGICAL) ×3
ELECTRODE REM PT RTRN 9FT ADLT (ELECTROSURGICAL) ×1 IMPLANT
GAUZE SPONGE 4X4 12PLY STRL (GAUZE/BANDAGES/DRESSINGS) ×3 IMPLANT
GLOVE BIO SURGEON STRL SZ8 (GLOVE) ×9 IMPLANT
GLOVE INDICATOR 8.0 STRL GRN (GLOVE) ×6 IMPLANT
GOWN STRL REUS W/ TWL LRG LVL3 (GOWN DISPOSABLE) ×2 IMPLANT
GOWN STRL REUS W/ TWL XL LVL3 (GOWN DISPOSABLE) ×1 IMPLANT
GOWN STRL REUS W/TWL LRG LVL3 (GOWN DISPOSABLE) ×4
GOWN STRL REUS W/TWL XL LVL3 (GOWN DISPOSABLE) ×2
GUIDEPIN VERSANAIL DSP 3.2X444 ×3 IMPLANT
GUIDEWIRE BALL NOSE 100CM (WIRE) ×3 IMPLANT
HIP FRA NAIL LAG SCREW 10.5X90 (Orthopedic Implant) ×3 IMPLANT
MAT BLUE FLOOR 46X72 FLO (MISCELLANEOUS) ×3 IMPLANT
NAIL HIP FRACTURE 11X380MM (Nail) ×3 IMPLANT
NEEDLE FILTER BLUNT 18X 1/2SAF (NEEDLE) ×2
NEEDLE FILTER BLUNT 18X1 1/2 (NEEDLE) ×1 IMPLANT
NEEDLE HYPO 22GX1.5 SAFETY (NEEDLE) ×3 IMPLANT
NS IRRIG 500ML POUR BTL (IV SOLUTION) ×3 IMPLANT
PACK HIP COMPR (MISCELLANEOUS) ×3 IMPLANT
SCREW BONE CORTICAL 5.0X42 (Screw) ×3 IMPLANT
SCREW LAG HIP FRA NAIL 10.5X90 (Orthopedic Implant) ×1 IMPLANT
STAPLER SKIN PROX 35W (STAPLE) ×3 IMPLANT
STRAP SAFETY BODY (MISCELLANEOUS) ×3 IMPLANT
SUT VIC AB 1 CT1 36 (SUTURE) ×3 IMPLANT
SUT VIC AB 2-0 CT1 (SUTURE) ×6 IMPLANT
SYR 30ML LL (SYRINGE) ×3 IMPLANT
SYRINGE 10CC LL (SYRINGE) ×3 IMPLANT
TAPE MICROFOAM 4IN (TAPE) ×3 IMPLANT

## 2015-11-15 NOTE — ED Notes (Signed)
Patient transported to X-ray 

## 2015-11-15 NOTE — H&P (Signed)
[facility] - [place] at [facility]   PATIENT NAME: Virginia Osborne    MR#:  [id number]  DATE OF BIRTH:  Mar 05, 1927  DATE OF ADMISSION:  11/15/2015  PRIMARY CARE PHYSICIAN: Margaretann Loveless, PA-C   REQUESTING/REFERRING PHYSICIAN: Dr. York Cerise  CHIEF COMPLAINT:   Chief Complaint  Patient presents with  . Fall    HISTORY OF PRESENT ILLNESS:  Virginia Osborne  is a 82 y.o. female with a known history of mild Dementia, depression presents to the emergency room due to recurrent falls. She has been found to have a right hip fracture. Her only concern is right hip pain when she moves. No heart disease or hypertension or diabetes or stroke. EKG today shows nothing acute.  History mainly obtained from daughter at bedside. Patient is poor historian unable to contribute to history.  PAST MEDICAL HISTORY:   Past Medical History:  Diagnosis Date  . Constipation   . Dementia   . Depression   . GERD (gastroesophageal reflux disease)   . Thyroid disease     PAST SURGICAL HISTORY:   Past Surgical History:  Procedure Laterality Date  . ABDOMINAL HYSTERECTOMY  1980   total  . CHOLECYSTECTOMY  1990    SOCIAL HISTORY:   Social History  Substance Use Topics  . Smoking status: Never Smoker  . Smokeless tobacco: Never Used  . Alcohol use No    FAMILY HISTORY:   Family History  Problem Relation Age of Onset  . Diabetes Sister     DRUG ALLERGIES:   Allergies  Allergen Reactions  . Erythromycin     Patient unsure of reaction    REVIEW OF SYSTEMS:   Review of Systems  Unable to perform ROS: Dementia    MEDICATIONS AT HOME:   Prior to Admission medications   Medication Sig Start Date End Date Taking? Authorizing Provider  levothyroxine (SYNTHROID, LEVOTHROID) 112 MCG tablet Take 112 mcg by mouth daily before breakfast.   Yes Historical Provider, MD  Naproxen Sod-Diphenhydramine (ALEVE PM) 220-25 MG TABS Take 1 tablet by mouth every 6 (six) hours  as needed.   Yes Historical Provider, MD  omeprazole (PRILOSEC) 20 MG capsule Take 20 mg by mouth daily.   Yes Historical Provider, MD  PARoxetine (PAXIL) 30 MG tablet Take 30 mg by mouth at bedtime.   Yes Historical Provider, MD  POLYETHYLENE GLYCOL 3350 PO Take 17 g by mouth as needed.    Yes Historical Provider, MD  traZODone (DESYREL) 150 MG tablet Take 300 mg by mouth every evening.   Yes Historical Provider, MD     VITAL SIGNS:  Blood pressure (!) 154/62, pulse 80, temperature 97.9 F (36.6 C), temperature source Oral, resp. rate (!) 22, height 5\' 5"  (1.651 m), weight 69.9 kg (154 lb), SpO2 96 %.  PHYSICAL EXAMINATION:  Physical Exam  GENERAL:  82 y.o.-year-old patient lying in the bed with no acute distress.  EYES: Pupils equal, round, reactive to light and accommodation. No scleral icterus. Extraocular muscles intact.  HEENT: Head atraumatic, normocephalic. Oropharynx and nasopharynx clear. No oropharyngeal erythema, moist oral mucosa  NECK:  Supple, no jugular venous distention. No thyroid enlargement, no tenderness.  LUNGS: Normal breath sounds bilaterally, no wheezing, rales, rhonchi. No use of accessory muscles of respiration.  CARDIOVASCULAR: S1, S2 normal. No murmurs, rubs, or gallops.  ABDOMEN: Soft, nontender, nondistended. Bowel sounds present. No organomegaly or mass.  EXTREMITIES: No pedal edema, cyanosis, or clubbing. + 2 pedal & radial pulses b/l.  Right hip tenderness NEUROLOGIC: Cranial nerves II through XII are intact. No focal Motor or sensory deficits appreciated b/l PSYCHIATRIC: The patient is alert and oriented x 3. Good affect.  SKIN: No obvious rash, lesion, or ulcer. Bruising  LABORATORY PANEL:   CBC  Recent Labs Lab 11/15/15 1001  WBC 11.5*  HGB 15.5  HCT 44.4  PLT 216   ------------------------------------------------------------------------------------------------------------------  Chemistries   Recent Labs Lab 11/15/15 1001  NA 135  K  3.9  CL 101  CO2 26  GLUCOSE 153*  BUN 13  CREATININE 0.76  CALCIUM 9.4   ------------------------------------------------------------------------------------------------------------------  Cardiac Enzymes No results for input(s): TROPONINI in the last 168 hours. ------------------------------------------------------------------------------------------------------------------  RADIOLOGY:  Dg Chest 1 View  Result Date: 11/15/2015 CLINICAL DATA:  Larey Seat this morning, fell yesterday as well, history dementia EXAM: CHEST 1 VIEW COMPARISON:  09/03/2013 FINDINGS: Enlargement of cardiac silhouette with pulmonary vascular congestion. Atherosclerotic calcification aorta. RIGHT basilar atelectasis. Increased LEFT lower lobe opacity question atelectasis versus consolidation. Slight accentuation of interstitial markings versus previous exam which could reflect supine technique or minimal congestion. No pleural effusion or pneumothorax. Bones demineralized. Surgical clips RIGHT upper quadrant question cholecystectomy. IMPRESSION: Enlargement of cardiac silhouette with pulmonary vascular congestion with question minimal edema. RIGHT basilar atelectasis with new consolidation versus atelectasis in LEFT lower lobe. Electronically Signed   By: Ulyses Southward M.D.   On: 11/15/2015 13:15   Dg Elbow Complete Left  Result Date: 11/15/2015 CLINICAL DATA:  Patient fell yesterday. EXAM: LEFT ELBOW - COMPLETE 3+ VIEW COMPARISON:  None. FINDINGS: No evidence of fracture of the ulna or humerus. The radial head is normal. No joint effusion. IMPRESSION: No fracture or dislocation. Electronically Signed   By: Genevive Bi M.D.   On: 11/15/2015 13:15   Ct Head Wo Contrast  Result Date: 11/15/2015 CLINICAL DATA:  Status post fall this morning with a blow to the back of the head. Bruising about the nose and left eye. Initial encounter. EXAM: CT HEAD WITHOUT CONTRAST CT MAXILLOFACIAL WITHOUT CONTRAST CT CERVICAL SPINE WITHOUT  CONTRAST TECHNIQUE: Multidetector CT imaging of the head, cervical spine, and maxillofacial structures were performed using the standard protocol without intravenous contrast. Multiplanar CT image reconstructions of the cervical spine and maxillofacial structures were also generated. COMPARISON:  None.  Head and cervical spine CT scans 09/01/2013. FINDINGS: CT HEAD FINDINGS Atrophy and chronic microvascular ischemic change are identified. No evidence of acute intracranial abnormality including hemorrhage, infarct, mass lesion, mass effect, midline shift or abnormal extra-axial fluid collection. No hydrocephalus or pneumocephalus. The calvarium is intact. CT MAXILLOFACIAL FINDINGS No facial bone fracture is identified. The globes are intact and the lenses are located. Orbital fat is clear. The mandibular condyles are located. Paranasal sinuses and mastoid air cells are clear. CT CERVICAL SPINE FINDINGS No cervical spine fracture or malalignment is identified. Scattered facet degenerative change is noted. There is some loss of disc space height at C5-6 and C6-7. Lung apices are clear. IMPRESSION: No acute abnormality head, face or cervical spine. Atrophy and chronic microvascular ischemic change. Mild cervical spondylosis. Electronically Signed   By: Drusilla Kanner M.D.   On: 11/15/2015 11:44   Ct Cervical Spine Wo Contrast  Result Date: 11/15/2015 CLINICAL DATA:  Status post fall this morning with a blow to the back of the head. Bruising about the nose and left eye. Initial encounter. EXAM: CT HEAD WITHOUT CONTRAST CT MAXILLOFACIAL WITHOUT CONTRAST CT CERVICAL SPINE WITHOUT CONTRAST TECHNIQUE: Multidetector CT imaging of the head, cervical  spine, and maxillofacial structures were performed using the standard protocol without intravenous contrast. Multiplanar CT image reconstructions of the cervical spine and maxillofacial structures were also generated. COMPARISON:  None.  Head and cervical spine CT scans  09/01/2013. FINDINGS: CT HEAD FINDINGS Atrophy and chronic microvascular ischemic change are identified. No evidence of acute intracranial abnormality including hemorrhage, infarct, mass lesion, mass effect, midline shift or abnormal extra-axial fluid collection. No hydrocephalus or pneumocephalus. The calvarium is intact. CT MAXILLOFACIAL FINDINGS No facial bone fracture is identified. The globes are intact and the lenses are located. Orbital fat is clear. The mandibular condyles are located. Paranasal sinuses and mastoid air cells are clear. CT CERVICAL SPINE FINDINGS No cervical spine fracture or malalignment is identified. Scattered facet degenerative change is noted. There is some loss of disc space height at C5-6 and C6-7. Lung apices are clear. IMPRESSION: No acute abnormality head, face or cervical spine. Atrophy and chronic microvascular ischemic change. Mild cervical spondylosis. Electronically Signed   By: Drusilla Kanner M.D.   On: 11/15/2015 11:44   Dg Knee Complete 4 Views Left  Result Date: 11/15/2015 CLINICAL DATA:  Larey Seat this morning with right-sided pain. EXAM: LEFT KNEE - COMPLETE 4+ VIEW COMPARISON:  None. FINDINGS: No evidence of fracture, dislocation, or large joint effusion. Small amount of fluid in the suprapatellar recess. No evidence of arthropathy or other focal bone abnormality. Soft tissues are unremarkable. IMPRESSION: Negative. Electronically Signed   By: Paulina Fusi M.D.   On: 11/15/2015 13:18   Dg Knee Complete 4 Views Right  Result Date: 11/15/2015 CLINICAL DATA:  Pain following fall EXAM: RIGHT KNEE - COMPLETE 4+ VIEW COMPARISON:  None. FINDINGS: Frontal, lateral, and bilateral oblique views were obtained. No fracture is evident. There is apparent lateral patellar subluxation without frank dislocation. There is a rather minimal joint effusion. There is mild joint space narrowing medially. There is mild spurring in all compartments. There are foci of chondrocalcinosis.  IMPRESSION: Lateral patellar subluxation with minimal joint effusion. No fracture or frank dislocation. Relatively mild osteoarthritic change medially. Chondrocalcinosis is present, a finding that may be seen with osteoarthritis or calcium pyrophosphate deposition disease. Electronically Signed   By: Bretta Bang III M.D.   On: 11/15/2015 13:15   Dg Hip Unilat W Or Wo Pelvis 2-3 Views Right  Result Date: 11/15/2015 CLINICAL DATA:  Larey Seat this morning.  Right hip pain. EXAM: DG HIP (WITH OR WITHOUT PELVIS) 2-3V RIGHT COMPARISON:  None. FINDINGS: Nondisplaced intertrochanteric fracture of the right femur. No other pelvic fracture. IMPRESSION: Nondisplaced intertrochanteric fracture of the right femur. Electronically Signed   By: Paulina Fusi M.D.   On: 11/15/2015 13:15   Ct Maxillofacial Wo Cm  Result Date: 11/15/2015 CLINICAL DATA:  Status post fall this morning with a blow to the back of the head. Bruising about the nose and left eye. Initial encounter. EXAM: CT HEAD WITHOUT CONTRAST CT MAXILLOFACIAL WITHOUT CONTRAST CT CERVICAL SPINE WITHOUT CONTRAST TECHNIQUE: Multidetector CT imaging of the head, cervical spine, and maxillofacial structures were performed using the standard protocol without intravenous contrast. Multiplanar CT image reconstructions of the cervical spine and maxillofacial structures were also generated. COMPARISON:  None.  Head and cervical spine CT scans 09/01/2013. FINDINGS: CT HEAD FINDINGS Atrophy and chronic microvascular ischemic change are identified. No evidence of acute intracranial abnormality including hemorrhage, infarct, mass lesion, mass effect, midline shift or abnormal extra-axial fluid collection. No hydrocephalus or pneumocephalus. The calvarium is intact. CT MAXILLOFACIAL FINDINGS No facial bone fracture is  identified. The globes are intact and the lenses are located. Orbital fat is clear. The mandibular condyles are located. Paranasal sinuses and mastoid air cells are  clear. CT CERVICAL SPINE FINDINGS No cervical spine fracture or malalignment is identified. Scattered facet degenerative change is noted. There is some loss of disc space height at C5-6 and C6-7. Lung apices are clear. IMPRESSION: No acute abnormality head, face or cervical spine. Atrophy and chronic microvascular ischemic change. Mild cervical spondylosis. Electronically Signed   By: Drusilla Kanner M.D.   On: 11/15/2015 11:44     IMPRESSION AND PLAN:   * Right hip fracture NPO Pain meds PRN Discussed with orthopedics Dr. Joice Lofts. Surgery later today.  * Bibasilar atelectasis vs pneumonia with acute hypoxic respiratory failure Treat with levaquin. Nebs PRN Incentive spirometer Wean o2 as tolerated  * Dementia Watch for inpatient delirium  * DVT prophylaxis Ordered SCDs. Lovenox to be ordered by orthopedics after surgery.   All the records are reviewed and case discussed with ED provider. Management plans discussed with the patient, family and they are in agreement.  CODE STATUS: FULL CODE  TOTAL TIME TAKING CARE OF THIS PATIENT: 40 minutes.   Milagros Loll R M.D on 11/15/2015 at 4:41 PM  Between 7am to 6pm - Pager - [phone]  After 6pm go to [email or web address] - password [email or web address] [facility]  [facility]  Office  [phone]  CC: Primary care physician; Margaretann Loveless, PA-C  Note: This dictation was prepared with Dragon dictation along with smaller phrase technology. Any transcriptional errors that result from this process are unintentional.

## 2015-11-15 NOTE — Transfer of Care (Signed)
Immediate Anesthesia Transfer of Care Note  Patient: Virginia Osborne  Procedure(s) Performed: Procedure(s): INTRAMEDULLARY (IM) NAIL INTERTROCHANTRIC (Right)  Patient Location: PACU  Anesthesia Type:General  Level of Consciousness: confused  Airway & Oxygen Therapy: Patient Spontanous Breathing and Patient connected to nasal cannula oxygen  Post-op Assessment: Report given to RN and Post -op Vital signs reviewed and stable  Post vital signs: Reviewed and stable  Last Vitals:  Vitals:   11/15/15 1600 11/15/15 1700  BP: (!) 162/77 (!) 151/80  Pulse:  85  Resp:  17  Temp:      Last Pain:  Vitals:   11/15/15 0948  TempSrc:   PainSc: 0-No pain         Complications: No apparent anesthesia complications

## 2015-11-15 NOTE — Op Note (Signed)
11/15/2015  7:50 PM  Patient:   Virginia Osborne  Pre-Op Diagnosis:   Nondisplaced 2-part intertrochanteric fracture, right hip.  Post-Op Diagnosis:   Same.  Procedure:   Reduction and internal fixation of 2-part intertrochanteric right hip fracture with Biomet Affyxis TFN nail.  Surgeon:   Maryagnes Amos, MD  Assistant:   None  Anesthesia:   General LMA  Findings:   As above  Complications:   None  EBL:   100 cc  Fluids:   1000 cc crystalloid  UOP:   200 cc  TT:   None  Drains:   None  Closure:   Staples  Implants:   Biomet Affyxis 11 x 380 mm TFN with a 90 mm lag screw and a 42 mm distal interlocking screw  Brief Clinical Note:   The patient is an 82 year old female who sustained the above-noted injury this morning when she apparently lost her balance and fell while in her home. She was brought to the emergency room where x-rays demonstrated the above-noted injury. The patient has been cleared medically and presents at this time for reduction and internal fixation of the intertrochanteric right hip fracture.  Procedure:   The patient was brought into the operating room. After adequate spinal anesthesia was obtained, the patient was lain in the supine position on the fracture table. The uninjured leg was placed in a flexed and abducted position while the injured lower extremity was placed in longitudinal traction. The fracture was reduced using longitudinal traction and internal rotation. The adequacy of reduction was verified fluoroscopically in AP and lateral projections and found to be near anatomic. The lateral aspects of the right hip and thigh were prepped with ChloraPrep solution before being draped sterilely. Preoperative antibiotics were administered. The greater trochanter was identified fluoroscopically and an approximately 3 cm incision made about 2-3 fingerbreadths above the tip of the greater trochanter. The incision was carried down through the subcutaneous  tissues to expose the gluteal fascia. This was split the length of the incision, providing access to the tip of the trochanter. Under fluoroscopic guidance, a guidewire was drilled through the tip of the trochanter into the proximal metaphysis to the level of the lesser trochanter. After verifying its position fluoroscopically in AP and lateral projections, it was overreamed with the initial reamer to the depth of the lesser trochanter. A guidewire was passed down through the femoral canal to the supracondylar region. The adequacy of guidewire position was verified fluoroscopically in AP and lateral projections before the length of the guidewire within the canal was measured and found to be 380 mm. It was overreamed sequentially using the flexible reamers, beginning with a 9 mm reamer and progressing to a 12.5 mm reamer. This provided good cortical chatter. The 11 x 380 mm Biomet Affyxis TFN rod was selected and advanced to the appropriate depth, as verified fluoroscopically. The guide system for the lag screw was positioned and advanced through an approximately 2 cm stab incision over the lateral aspect of the proximal femur. The guidewire was drilled up through the trochanteric femoral nail and into the femoral neck to rest within 5 mm of subchondral bone. After verifying its position in the femoral neck and head in both AP and lateral projections, the guidewire was measured and found to be optimally replicated by a 90 mm lag screw. The guidewire was overreamed to the appropriate depth before the lag screw was inserted and advanced to the appropriate depth as verified fluoroscopically in AP and  lateral projections. The locking screw was advanced, then backed off a quarter turn to set the lag screw. Again the adequacy of hardware position and fracture reduction was verified fluoroscopically in AP and lateral projections and found to be excellent.  Attention was directed distally. Using the "perfect circle"  technique, the leg and fluoroscopy machine were positioned appropriately. An approximate 1.5 cm stab incision was made over the skin at the appropriate point before the drill bit was advanced through the cortex and across the static hole of the nail. The appropriate length of the screw was determined before the 42 mm distal interlocking screw was positioned, then advanced and tightened securely. Again the adequacy of screw position was verified fluoroscopically in AP and lateral projections and found to be excellent.  The wounds were irrigated thoroughly with sterile saline solution before the deeper subcutaneous tissues were closed using 2-0 Vicryl interrupted sutures. The skin was closed using staples. Sterile occlusive dressings were applied to all wounds before the patient was transferred back to his/her hospital bed. The patient was then transferred to the recovery room in satisfactory condition after tolerating the procedure well.

## 2015-11-15 NOTE — Consult Note (Signed)
ORTHOPAEDIC CONSULTATION  REQUESTING PHYSICIAN: Governor Rooks, MD  Chief Complaint:   Right hip pain.  History of Present Illness: Virginia Osborne is a 82 y.o. female with a history of depression and mild dementia who lives independently by herself. Apparently, she fell yesterday morning, bruising and skinning both knees and bumping her head, but was able to get around. She again fell this morning and injured her right hip. She was unable to ambulate, so the EMS was called and she was brought to the emergency room where x-rays demonstrated a nondisplaced intertrochanteric fracture of her right hip. The patient denies any loss of consciousness or other injury as a result of the fall, and denies any lightheadedness, dizziness, chest pain, shortness of breath, or other symptoms that may have precipitated her fall.  Past Medical History:  Diagnosis Date  . Constipation   . Dementia   . Depression   . GERD (gastroesophageal reflux disease)   . Thyroid disease    Past Surgical History:  Procedure Laterality Date  . ABDOMINAL HYSTERECTOMY  1980   total  . CHOLECYSTECTOMY  1990   Social History   Social History  . Marital status: Widowed    Spouse name: N/A  . Number of children: N/A  . Years of education: N/A   Social History Main Topics  . Smoking status: Never Smoker  . Smokeless tobacco: Never Used  . Alcohol use No  . Drug use: No  . Sexual activity: Not Asked   Other Topics Concern  . None   Social History Narrative  . None   Family History  Problem Relation Age of Onset  . Diabetes Sister    Allergies  Allergen Reactions  . Erythromycin     Patient unsure of reaction   Prior to Admission medications   Medication Sig Start Date End Date Taking? Authorizing Provider  levothyroxine (SYNTHROID, LEVOTHROID) 112 MCG tablet Take 112 mcg by mouth daily before breakfast.   Yes Historical Provider, MD   Naproxen Sod-Diphenhydramine (ALEVE PM) 220-25 MG TABS Take 1 tablet by mouth every 6 (six) hours as needed.   Yes Historical Provider, MD  omeprazole (PRILOSEC) 20 MG capsule Take 20 mg by mouth daily.   Yes Historical Provider, MD  PARoxetine (PAXIL) 30 MG tablet Take 30 mg by mouth at bedtime.   Yes Historical Provider, MD  POLYETHYLENE GLYCOL 3350 PO Take 17 g by mouth as needed.    Yes Historical Provider, MD  traZODone (DESYREL) 150 MG tablet Take 300 mg by mouth every evening.   Yes Historical Provider, MD   Dg Chest 1 View  Result Date: 11/15/2015 CLINICAL DATA:  Larey Seat this morning, fell yesterday as well, history dementia EXAM: CHEST 1 VIEW COMPARISON:  09/03/2013 FINDINGS: Enlargement of cardiac silhouette with pulmonary vascular congestion. Atherosclerotic calcification aorta. RIGHT basilar atelectasis. Increased LEFT lower lobe opacity question atelectasis versus consolidation. Slight accentuation of interstitial markings versus previous exam which could reflect supine technique or minimal congestion. No pleural effusion or pneumothorax. Bones demineralized. Surgical clips RIGHT upper quadrant question cholecystectomy. IMPRESSION: Enlargement of cardiac silhouette with pulmonary vascular congestion with question minimal edema. RIGHT basilar atelectasis with new consolidation versus atelectasis in LEFT lower lobe. Electronically Signed   By: Ulyses Southward M.D.   On: 11/15/2015 13:15   Dg Elbow Complete Left  Result Date: 11/15/2015 CLINICAL DATA:  Patient fell yesterday. EXAM: LEFT ELBOW - COMPLETE 3+ VIEW COMPARISON:  None. FINDINGS: No evidence of fracture of the ulna or humerus.  The radial head is normal. No joint effusion. IMPRESSION: No fracture or dislocation. Electronically Signed   By: Genevive Bi M.D.   On: 11/15/2015 13:15   Ct Head Wo Contrast  Result Date: 11/15/2015 CLINICAL DATA:  Status post fall this morning with a blow to the back of the head. Bruising about the nose and  left eye. Initial encounter. EXAM: CT HEAD WITHOUT CONTRAST CT MAXILLOFACIAL WITHOUT CONTRAST CT CERVICAL SPINE WITHOUT CONTRAST TECHNIQUE: Multidetector CT imaging of the head, cervical spine, and maxillofacial structures were performed using the standard protocol without intravenous contrast. Multiplanar CT image reconstructions of the cervical spine and maxillofacial structures were also generated. COMPARISON:  None.  Head and cervical spine CT scans 09/01/2013. FINDINGS: CT HEAD FINDINGS Atrophy and chronic microvascular ischemic change are identified. No evidence of acute intracranial abnormality including hemorrhage, infarct, mass lesion, mass effect, midline shift or abnormal extra-axial fluid collection. No hydrocephalus or pneumocephalus. The calvarium is intact. CT MAXILLOFACIAL FINDINGS No facial bone fracture is identified. The globes are intact and the lenses are located. Orbital fat is clear. The mandibular condyles are located. Paranasal sinuses and mastoid air cells are clear. CT CERVICAL SPINE FINDINGS No cervical spine fracture or malalignment is identified. Scattered facet degenerative change is noted. There is some loss of disc space height at C5-6 and C6-7. Lung apices are clear. IMPRESSION: No acute abnormality head, face or cervical spine. Atrophy and chronic microvascular ischemic change. Mild cervical spondylosis. Electronically Signed   By: Drusilla Kanner M.D.   On: 11/15/2015 11:44   Ct Cervical Spine Wo Contrast  Result Date: 11/15/2015 CLINICAL DATA:  Status post fall this morning with a blow to the back of the head. Bruising about the nose and left eye. Initial encounter. EXAM: CT HEAD WITHOUT CONTRAST CT MAXILLOFACIAL WITHOUT CONTRAST CT CERVICAL SPINE WITHOUT CONTRAST TECHNIQUE: Multidetector CT imaging of the head, cervical spine, and maxillofacial structures were performed using the standard protocol without intravenous contrast. Multiplanar CT image reconstructions of the  cervical spine and maxillofacial structures were also generated. COMPARISON:  None.  Head and cervical spine CT scans 09/01/2013. FINDINGS: CT HEAD FINDINGS Atrophy and chronic microvascular ischemic change are identified. No evidence of acute intracranial abnormality including hemorrhage, infarct, mass lesion, mass effect, midline shift or abnormal extra-axial fluid collection. No hydrocephalus or pneumocephalus. The calvarium is intact. CT MAXILLOFACIAL FINDINGS No facial bone fracture is identified. The globes are intact and the lenses are located. Orbital fat is clear. The mandibular condyles are located. Paranasal sinuses and mastoid air cells are clear. CT CERVICAL SPINE FINDINGS No cervical spine fracture or malalignment is identified. Scattered facet degenerative change is noted. There is some loss of disc space height at C5-6 and C6-7. Lung apices are clear. IMPRESSION: No acute abnormality head, face or cervical spine. Atrophy and chronic microvascular ischemic change. Mild cervical spondylosis. Electronically Signed   By: Drusilla Kanner M.D.   On: 11/15/2015 11:44   Dg Knee Complete 4 Views Left  Result Date: 11/15/2015 CLINICAL DATA:  Larey Seat this morning with right-sided pain. EXAM: LEFT KNEE - COMPLETE 4+ VIEW COMPARISON:  None. FINDINGS: No evidence of fracture, dislocation, or large joint effusion. Small amount of fluid in the suprapatellar recess. No evidence of arthropathy or other focal bone abnormality. Soft tissues are unremarkable. IMPRESSION: Negative. Electronically Signed   By: Paulina Fusi M.D.   On: 11/15/2015 13:18   Dg Knee Complete 4 Views Right  Result Date: 11/15/2015 CLINICAL DATA:  Pain following fall  EXAM: RIGHT KNEE - COMPLETE 4+ VIEW COMPARISON:  None. FINDINGS: Frontal, lateral, and bilateral oblique views were obtained. No fracture is evident. There is apparent lateral patellar subluxation without frank dislocation. There is a rather minimal joint effusion. There is mild  joint space narrowing medially. There is mild spurring in all compartments. There are foci of chondrocalcinosis. IMPRESSION: Lateral patellar subluxation with minimal joint effusion. No fracture or frank dislocation. Relatively mild osteoarthritic change medially. Chondrocalcinosis is present, a finding that may be seen with osteoarthritis or calcium pyrophosphate deposition disease. Electronically Signed   By: Bretta Bang III M.D.   On: 11/15/2015 13:15   Dg Hip Unilat W Or Wo Pelvis 2-3 Views Right  Result Date: 11/15/2015 CLINICAL DATA:  Larey Seat this morning.  Right hip pain. EXAM: DG HIP (WITH OR WITHOUT PELVIS) 2-3V RIGHT COMPARISON:  None. FINDINGS: Nondisplaced intertrochanteric fracture of the right femur. No other pelvic fracture. IMPRESSION: Nondisplaced intertrochanteric fracture of the right femur. Electronically Signed   By: Paulina Fusi M.D.   On: 11/15/2015 13:15   Ct Maxillofacial Wo Cm  Result Date: 11/15/2015 CLINICAL DATA:  Status post fall this morning with a blow to the back of the head. Bruising about the nose and left eye. Initial encounter. EXAM: CT HEAD WITHOUT CONTRAST CT MAXILLOFACIAL WITHOUT CONTRAST CT CERVICAL SPINE WITHOUT CONTRAST TECHNIQUE: Multidetector CT imaging of the head, cervical spine, and maxillofacial structures were performed using the standard protocol without intravenous contrast. Multiplanar CT image reconstructions of the cervical spine and maxillofacial structures were also generated. COMPARISON:  None.  Head and cervical spine CT scans 09/01/2013. FINDINGS: CT HEAD FINDINGS Atrophy and chronic microvascular ischemic change are identified. No evidence of acute intracranial abnormality including hemorrhage, infarct, mass lesion, mass effect, midline shift or abnormal extra-axial fluid collection. No hydrocephalus or pneumocephalus. The calvarium is intact. CT MAXILLOFACIAL FINDINGS No facial bone fracture is identified. The globes are intact and the lenses are  located. Orbital fat is clear. The mandibular condyles are located. Paranasal sinuses and mastoid air cells are clear. CT CERVICAL SPINE FINDINGS No cervical spine fracture or malalignment is identified. Scattered facet degenerative change is noted. There is some loss of disc space height at C5-6 and C6-7. Lung apices are clear. IMPRESSION: No acute abnormality head, face or cervical spine. Atrophy and chronic microvascular ischemic change. Mild cervical spondylosis. Electronically Signed   By: Drusilla Kanner M.D.   On: 11/15/2015 11:44    Positive ROS: All other systems have been reviewed and were otherwise negative with the exception of those mentioned in the HPI and as above.  Physical Exam: General:  Alert, no acute distress Psychiatric:  Patient has normal mood and affect, but appears somewhat confused Cardiovascular:  No pedal edema Respiratory:  No wheezing, non-labored breathing GI:  Abdomen is soft and non-tender Skin:  No lesions in the area of chief complaint Neurologic:  Sensation intact distally Lymphatic:  No axillary or cervical lymphadenopathy  Orthopedic Exam:  Orthopedic examination is limited to the right hip and lower extremity. The right lower extremity is slightly shortened and externally rotated as compared to the left. Skin inspection around the right hip is unremarkable. However, she does have an abrasion over the anterior aspect of her right knee secondary to an apparent fall yesterday. She has mild tenderness to palpation over the trochanteric region laterally. She has more significant pain with any attempted active or passive motion of the right hip. She is neurovascularly intact to the right lower extremity  and foot.  X-rays:  Recent x-rays of the pelvis and right hip are available for review. These films demonstrate a nondisplaced 2-part intertrochanteric fracture of the right hip. No significant degenerative changes are noted. No other bony abnormalities are  identified.  Assessment: Nondisplaced 2-part intertrochanteric fracture right hip.  Plan: The treatment options were discussed with the patient and her daughter, who is at the bedside, including both surgical and nonsurgical options. Both the patient and her daughter would like to proceed with surgical intervention to include a trochanteric femoral nailing of the right hip fracture. This procedure has been discussed in detail, as have the potential risks (including bleeding, infection, nerve and/or blood vessel injury, persistent or recurrent pain, stiffness, malunion and/or nonunion, need for further surgery, blood clots, strokes, heart attacks and/or arrhythmias, etc.) and benefits. The patient and her daughter state their understanding and wish to proceed. A formal written consent has been obtained.  Thank you for ask me to participate in the care of this pleasant unfortunate woman. I will be happy to follow her with you.   Maryagnes Amos, MD  Beeper #:  [phone]  11/15/2015 4:25 PM

## 2015-11-15 NOTE — ED Notes (Signed)
Report called to OR RN.  Orderly here and pt transferred via stretcher to OR.  Family with pt.

## 2015-11-15 NOTE — ED Notes (Signed)
bcx2 sent

## 2015-11-15 NOTE — ED Notes (Signed)
Handoff given to Ashley, RN

## 2015-11-15 NOTE — ED Notes (Signed)
Pt restless and fidgety upon assessment, pt daughter states pt has "been talking out of her head for the last hour."  She is concerned that she is more altered than she was on arrival to ED.  MD notified and to bedside with update.

## 2015-11-15 NOTE — Progress Notes (Signed)
ANTIBIOTIC CONSULT NOTE - INITIAL  Pharmacy Consult for Levaquin  Indication: pneumonia  Allergies  Allergen Reactions  . Erythromycin     Patient unsure of reaction    Patient Measurements: Height: 5\' 5"  (165.1 cm) Weight: 154 lb (69.9 kg) IBW/kg (Calculated) : 57 Adjusted Body Weight:   Vital Signs: Temp: 98.5 F (36.9 C) (08/03 2015) Temp Source: Oral (08/03 0946) BP: 108/50 (08/03 2026) Pulse Rate: 93 (08/03 2026) Intake/Output from previous day: No intake/output data recorded. Intake/Output from this shift: Total I/O In: 1250 [I.V.:1250] Out: 330 [Urine:230; Blood:100]  Labs:  Recent Labs  11/15/15 1001  WBC 11.5*  HGB 15.5  PLT 216  CREATININE 0.76   Estimated Creatinine Clearance: 48.6 mL/min (by C-G formula based on SCr of 0.8 mg/dL). No results for input(s): VANCOTROUGH, VANCOPEAK, VANCORANDOM, GENTTROUGH, GENTPEAK, GENTRANDOM, TOBRATROUGH, TOBRAPEAK, TOBRARND, AMIKACINPEAK, AMIKACINTROU, AMIKACIN in the last 72 hours.   Microbiology: No results found for this or any previous visit (from the past 720 hour(s)).  Medical History: Past Medical History:  Diagnosis Date  . Constipation   . Dementia   . Depression   . GERD (gastroesophageal reflux disease)   . Thyroid disease     Medications:  Prescriptions Prior to Admission  Medication Sig Dispense Refill Last Dose  . levothyroxine (SYNTHROID, LEVOTHROID) 112 MCG tablet Take 112 mcg by mouth daily before breakfast.   11/14/2015 at Unknown time  . Naproxen Sod-Diphenhydramine (ALEVE PM) 220-25 MG TABS Take 1 tablet by mouth every 6 (six) hours as needed.   11/14/2015 at Unknown time  . omeprazole (PRILOSEC) 20 MG capsule Take 20 mg by mouth daily.   11/14/2015 at Unknown time  . PARoxetine (PAXIL) 30 MG tablet Take 30 mg by mouth at bedtime.   11/14/2015 at Unknown time  . POLYETHYLENE GLYCOL 3350 PO Take 17 g by mouth as needed.    11/14/2015 at Unknown time  . traZODone (DESYREL) 150 MG tablet Take 300 mg  by mouth every evening.   11/14/2015 at Unknown time   Assessment: CrCl = 48.6 ml/min  Goal of Therapy:  resolution of infection  Plan:  Expected duration 7 days with resolution of temperature and/or normalization of WBC   Levaquin 750 mg IV X 1 given on 8/3 . Levaquin 500 mg IV Q24H originally ordered.  Will adjust dose to levaquin 250 mg IV Q24H to start 8/4 @ 18:00.   Keilin Gamboa D 11/15/2015,8:37 PM

## 2015-11-15 NOTE — ED Triage Notes (Signed)
Pt to ED via EMS after falling this morning.  Pt also fell yesterday and has abrasions on bilateral knees, bruising on left occipital area, left elbow abrasion, soreness on right hip.  Pt is not able to recall whether or not she felt dizzy or light-headed prior to fall today. She states she was at the mailbox for the prior fall.

## 2015-11-15 NOTE — ED Notes (Signed)
Pt denies pain, but states if she moves her knees they are "extremely painful".  Pt also states her R hip hurts when I put slight amount of pressure on it.

## 2015-11-15 NOTE — Anesthesia Procedure Notes (Signed)
Procedure Name: LMA Insertion Date/Time: 11/15/2015 6:21 PM Performed by: Irving Burton Pre-anesthesia Checklist: Patient identified, Emergency Drugs available, Suction available and Patient being monitored Patient Re-evaluated:Patient Re-evaluated prior to inductionOxygen Delivery Method: Circle system utilized Preoxygenation: Pre-oxygenation with 100% oxygen Intubation Type: IV induction Ventilation: Mask ventilation without difficulty LMA: LMA inserted LMA Size: 3.5 Number of attempts: 1 Placement Confirmation: positive ETCO2 and breath sounds checked- equal and bilateral Tube secured with: Tape Dental Injury: Teeth and Oropharynx as per pre-operative assessment

## 2015-11-15 NOTE — ED Notes (Signed)
Patient transported to CT 

## 2015-11-15 NOTE — ED Provider Notes (Signed)
[facility] Emergency Department Provider Note ____________________________________________  Time seen:  I have reviewed the triage vital signs and the triage nursing note.  HISTORY  Chief Complaint Fall   Historian Patient and daughter  HPI [place] is a 82 y.o. female with history of dementia and is here with multiple falls. She had a fall yesterday and sustained head injury with bruising to her face, and also abrasions to both knees, and did not seek evaluation. Today she had an additional unwitnessed fall and is complaining of right hip pain, and headache.  She does have dementia, but seems to have been a little bit more confused over the past week or so.  Unknown whether or not she had any loss of consciousness. No witnessed seizures.  Plan the hip seems to be moderate and worse with range of motion.    Past Medical History:  Diagnosis Date  . Constipation   . Dementia   . Depression   . GERD (gastroesophageal reflux disease)   . Thyroid disease     Patient Active Problem List   Diagnosis Date Noted  . Abnormal blood sugar 10/10/2014  . B12 deficiency 10/10/2014  . Avitaminosis D 01/31/2009  . Deficiency of vitamin B 10/02/2008  . Cannot sleep 01/19/2008  . Clinical depression 11/03/2007  . Acid reflux 11/03/2007  . Hypercholesteremia 11/03/2007  . Adult hypothyroidism 11/03/2007    Past Surgical History:  Procedure Laterality Date  . ABDOMINAL HYSTERECTOMY  1980   total  . CHOLECYSTECTOMY  1990    Prior to Admission medications   Medication Sig Start Date End Date Taking? Authorizing Provider  levothyroxine (SYNTHROID, LEVOTHROID) 112 MCG tablet Take 112 mcg by mouth daily before breakfast.   Yes Historical Provider, MD  Naproxen Sod-Diphenhydramine (ALEVE PM) 220-25 MG TABS Take 1 tablet by mouth every 6 (six) hours as needed.   Yes Historical Provider, MD  omeprazole (PRILOSEC) 20 MG capsule Take 20 mg by mouth daily.    Yes Historical Provider, MD  PARoxetine (PAXIL) 30 MG tablet Take 30 mg by mouth at bedtime.   Yes Historical Provider, MD  POLYETHYLENE GLYCOL 3350 PO Take 17 g by mouth as needed.    Yes Historical Provider, MD  traZODone (DESYREL) 150 MG tablet Take 300 mg by mouth every evening.   Yes Historical Provider, MD    Allergies  Allergen Reactions  . Erythromycin     Patient unsure of reaction    Family History  Problem Relation Age of Onset  . Diabetes Sister     Social History Social History  Substance Use Topics  . Smoking status: Never Smoker  . Smokeless tobacco: Never Used  . Alcohol use No    Review of Systems  Constitutional: Negative for recent illnesses or fevers. Eyes: Negative for visual changes. ENT: Negative for sore throat. Cardiovascular: Negative for chest pain. Respiratory: Negative for shortness of breath. Gastrointestinal: Negative for abdominal pain, vomiting and diarrhea. Genitourinary: Negative for dysuria. Musculoskeletal: Negative for back pain. Skin: Negative for rash. Neurological: Positive for headache. 10 point Review of Systems otherwise negative ____________________________________________   PHYSICAL EXAM:  VITAL SIGNS: ED Triage Vitals  Enc Vitals Group     BP 11/15/15 0946 (!) 158/64     Pulse Rate 11/15/15 0946 90     Resp 11/15/15 0946 (!) 27     Temp 11/15/15 0946 97.9 F (36.6 C)     Temp Source 11/15/15 0946 Oral     SpO2 11/15/15  0946 (S) (!) 89 %     Weight 11/15/15 0946 154 lb (69.9 kg)     Height 11/15/15 0946  (1.651 m)     Head Circumference --      Peak Flow --      Pain Score 11/15/15 0948 0     Pain Loc --      Pain Edu? --      Excl. in GC? --      Constitutional: Alert and Cooperative but disoriented. Well appearing and in no distress. HEENT   Head: Facial ecchymosis over the forehead and nasal bridge. No nasal septal hematoma.      Eyes: Conjunctivae are normal. PERRL. Normal extraocular  movements.      Ears:         Nose: No congestion/rhinnorhea.   Mouth/Throat: Mucous membranes are moist.   Neck: No stridor. Mild tenderness without step-offs in the posterior C-spine area. Cardiovascular/Chest: Normal rate, regular rhythm.  No murmurs, rubs, or gallops.  Ecchymosis to the chest wall. No flail chest or significant tenderness on compression of the chest wall. Respiratory: Normal respiratory effort without tachypnea nor retractions. Breath sounds are clear and equal bilaterally. No wheezes/rales/rhonchi. Gastrointestinal: Soft. No distention, no guarding, no rebound. Nontender.    Genitourinary/rectal:Deferred Musculoskeletal: Pelvis stable. Right hip pain without deformity, or shortening of the leg. Abrasions over both knees with ecchymosis over the right knee and mild tenderness with range of motion bilaterally. Left elbow ecchymosis, minimally tender, no deformity. Neurologic:  Poor historian but cooperative. No facial droop. Normal speech and language. No gross or focal neurologic deficits are appreciated. Skin:  Skin is warm, dry and intact. No rash noted. Scabbed abrasions over both knees. Psychiatric: Mood and affect are normal. Speech and behavior are normal. Patient exhibits appropriate insight and judgment.  ____________________________________________   EKG I, Governor Rooks, MD, the attending physician have personally viewed and interpreted all ECGs.  80 bpm. Normal sinus rhythm. Narrow QRS. Normal axis. Normal ST and T-wave ____________________________________________  LABS (pertinent positives/negatives)  Labs Reviewed  BASIC METABOLIC PANEL - Abnormal; Notable for the following:       Result Value   Glucose, Bld 153 (*)    All other components within normal limits  CBC WITH DIFFERENTIAL/PLATELET - Abnormal; Notable for the following:    WBC 11.5 (*)    Neutro Abs 10.5 (*)    Lymphs Abs 0.4 (*)    All other components within normal limits   URINALYSIS COMPLETEWITH MICROSCOPIC ([facility] ONLY) - Abnormal; Notable for the following:    Color, Urine STRAW (*)    APPearance CLEAR (*)    Ketones, ur TRACE (*)    Hgb urine dipstick 1+ (*)    Squamous Epithelial / LPF 0-5 (*)    All other components within normal limits  GLUCOSE, CAPILLARY - Abnormal; Notable for the following:    Glucose-Capillary 122 (*)    All other components within normal limits  URINE CULTURE  CULTURE, BLOOD (ROUTINE X 2)  CULTURE, BLOOD (ROUTINE X 2)  LACTIC ACID, PLASMA  LACTIC ACID, PLASMA    ____________________________________________  RADIOLOGY All Xrays were viewed by me. Imaging interpreted by Radiologist.  CT noncontrast head, cervical spine, and maxillofacial:  IMPRESSION: No acute abnormality head, face or cervical spine.  Atrophy and chronic microvascular ischemic change.  Mild cervical spondylosis.   Right hip x-ray: Nondisplaced intertrochanteric fracture of the right femur.  Left Knee x-ray: Negative  Right knee x-ray:IMPRESSION: Lateral patellar subluxation  with minimal joint effusion. No fracture or frank dislocation. Relatively mild osteoarthritic change medially. Chondrocalcinosis is present, a finding that may be seen with osteoarthritis or calcium pyrophosphate deposition disease.  Left elbow x-ray: No fracture dislocation  Chest x-ray 1 view:  IMPRESSION: Enlargement of cardiac silhouette with pulmonary vascular congestion with question minimal edema.  RIGHT basilar atelectasis with new consolidation versus atelectasis in LEFT lower lobe.   __________________________________________  PROCEDURES  Procedure(s) performed: None  Critical Care performed: None  ____________________________________________   ED COURSE / ASSESSMENT AND PLAN  Pertinent labs & imaging results that were available during my care of the patient were reviewed by me and considered in my medical decision making (see chart for  details).   This patient arrives for evaluation after a fall, but she has had a fall yesterday. Head, C-spine, and maxillofacial CT without significant traumatic injury.  Patient does have an intertrochanteric right hip fracture, consulted with orthopedics or see her in the hospital. Patient to be admitted by hospitalist.  On medical evaluation, she has had an episode of hypoxia here to 88%, and on chest x-ray has an area on the left lung concerning for possible infiltrate, and given the hypoxia with a slightly elevated white blood cell count, and some altered mental status, I will go ahead and cover her for community-acquired pneumonia. She has an allergy to erythromycin, and I will give her Levaquin.  I added on additional evaluation for possibility of sepsis although she has had no fever here, no tachycardia, and no hypotension. Lactate pending upon transfer of care to hospitalist for admission.    CONSULTATIONS:   Hospitalist for admission.  Orthopedics, Dr. Joice Lofts by phone who will see in the hospital.   Patient / Family / Caregiver informed of clinical course, medical decision-making process, and agree with plan.   I discussed return precautions, follow-up instructions, and discharged instructions with patient and/or family.   ___________________________________________   FINAL CLINICAL IMPRESSION(S) / ED DIAGNOSES   Final diagnoses:  Hip fracture, right, closed, initial encounter (HCC)  Altered mental status, unspecified altered mental status type  Pneumonia involving left lung, unspecified part of lung  Contusion of face, initial encounter  Abrasions of multiple sites  Multiple contusions              Note: This dictation was prepared with Dragon dictation. Any transcriptional errors that result from this process are unintentional    Governor Rooks, MD 11/15/15 1521

## 2015-11-15 NOTE — ED Notes (Signed)
Patient's daughter reports that the patient has been "acting strange" for the past month.  Pt previously had mild dementia, but daughter states she has had multiple episodes of worsening confusion, calling daughter in the middle of the night asking for lunch, going out of the house at unusual times of the day.

## 2015-11-15 NOTE — Anesthesia Preprocedure Evaluation (Signed)
Anesthesia Evaluation  Patient identified by MRN, date of birth, ID band Patient awake    Reviewed: Allergy & Precautions, H&P , NPO status , Patient's Chart, lab work & pertinent test results, reviewed documented beta blocker date and time   Airway Mallampati: III  TM Distance: >3 FB Neck ROM: full    Dental no notable dental hx. (+) Teeth Intact   Pulmonary neg pulmonary ROS,    Pulmonary exam normal breath sounds clear to auscultation       Cardiovascular Exercise Tolerance: Good negative cardio ROS Normal cardiovascular exam Rhythm:regular Rate:Normal     Neuro/Psych PSYCHIATRIC DISORDERS negative neurological ROS  negative psych ROS   GI/Hepatic negative GI ROS, Neg liver ROS, GERD  Medicated,  Endo/Other  negative endocrine ROSHypothyroidism   Renal/GU negative Renal ROS  negative genitourinary   Musculoskeletal   Abdominal   Peds  Hematology negative hematology ROS (+)   Anesthesia Other Findings   Reproductive/Obstetrics (+) Pregnancy                             Anesthesia Physical Anesthesia Plan  ASA: III and emergent  Anesthesia Plan: Regional and Spinal   Post-op Pain Management:    Induction:   Airway Management Planned:   Additional Equipment:   Intra-op Plan:   Post-operative Plan:   Informed Consent: I have reviewed the patients History and Physical, chart, labs and discussed the procedure including the risks, benefits and alternatives for the proposed anesthesia with the patient or authorized representative who has indicated his/her understanding and acceptance.     Plan Discussed with: CRNA  Anesthesia Plan Comments:         Anesthesia Quick Evaluation

## 2015-11-15 NOTE — ED Notes (Signed)
C-collar removed per Dr. Shaune Pollack.  Radiology notified to get pt for xrays.

## 2015-11-16 ENCOUNTER — Inpatient Hospital Stay: Payer: Medicare Other

## 2015-11-16 ENCOUNTER — Encounter: Payer: Self-pay | Admitting: Surgery

## 2015-11-16 LAB — BASIC METABOLIC PANEL
Anion gap: 2 — ABNORMAL LOW (ref 5–15)
BUN: 10 mg/dL (ref 6–20)
CALCIUM: 7.7 mg/dL — AB (ref 8.9–10.3)
CO2: 28 mmol/L (ref 22–32)
CREATININE: 0.65 mg/dL (ref 0.44–1.00)
Chloride: 107 mmol/L (ref 101–111)
GFR calc non Af Amer: >60 mL/min (ref >60)
Glucose, Bld: 121 mg/dL — ABNORMAL HIGH (ref 65–99)
Potassium: 3.8 mmol/L (ref 3.5–5.1)
SODIUM: 137 mmol/L (ref 135–145)

## 2015-11-16 LAB — CBC
HCT: 30.3 % — ABNORMAL LOW (ref 35.0–47.0)
Hemoglobin: 10.8 g/dL — ABNORMAL LOW (ref 12.0–16.0)
MCH: 33.9 pg (ref 26.0–34.0)
MCHC: 35.6 g/dL (ref 32.0–36.0)
MCV: 95.3 fL (ref 80.0–100.0)
PLATELETS: 163 10*3/uL (ref 150–440)
RBC: 3.17 MIL/uL — AB (ref 3.80–5.20)
RDW: 13.6 % (ref 11.5–14.5)
WBC: 6.3 10*3/uL (ref 3.6–11.0)

## 2015-11-16 LAB — URINE CULTURE

## 2015-11-16 MED ORDER — LEVOFLOXACIN 500 MG PO TABS: 500.0000 mg | ORAL_TABLET | Freq: Every day | ORAL | 0 refills | Status: DC

## 2015-11-16 MED ORDER — LEVOFLOXACIN IN D5W 500 MG/100ML IV SOLN
500.0000 mg | INTRAVENOUS | Status: DC
  Administered 2015-11-16 – 2015-11-17 (×2): 500 mg via INTRAVENOUS
  Filled 2015-11-16 (×3): qty 100

## 2015-11-16 MED ORDER — MAGNESIUM HYDROXIDE 400 MG/5ML PO SUSP: 30.0000 mL | Freq: Every day | ORAL | 0 refills | Status: AC | PRN

## 2015-11-16 MED ORDER — OXYCODONE HCL 5 MG PO TABS: 5.0000 mg | ORAL_TABLET | ORAL | 0 refills | Status: DC | PRN

## 2015-11-16 MED ORDER — ENOXAPARIN SODIUM 40 MG/0.4ML ~~LOC~~ SOLN: 40.0000 mg | SUBCUTANEOUS | 0 refills | Status: DC

## 2015-11-16 NOTE — Progress Notes (Signed)
Nutrition Brief Note  Patient identified on the Malnutrition Screening Tool (MST) Report.  Met with patient and daughter at bedside who report patient was eating well at home PTA.  Daughter states patient eats meals consistently- breakfast, Meals on Wheels for lunch, and a banana sandwich for dinner. No recent weight change.  Deny nutrition-related concerns.  Wt Readings from Last 15 Encounters:  11/16/15 158 lb 9.6 oz (71.9 kg)  02/20/15 164 lb (74.4 kg)  11/06/14 155 lb (70.3 kg)  11/05/14 155 lb (70.3 kg)  11/01/14 155 lb (70.3 kg)  06/15/14 151 lb (68.5 kg)    Body mass index is 26.39 kg/m. Patient meets criteria for WNL based on current BMI.   Current diet order is Regular. Labs and medications reviewed.   No nutrition interventions warranted at this time. If nutrition issues arise, please consult RD.   Brynda Greathouse, MS RD LDN Clinical Inpatient Dietitian

## 2015-11-16 NOTE — Progress Notes (Signed)
Pharmacy Antibiotic Note  [place] is a 82 y.o. female admitted on 11/15/2015 with pneumonia and hip fracture.  Pharmacy monitoring for Levaquin dosing.  Plan: The dose of Levaquin will be adjusted to 500mg  IV q24h based on renal function.  Height: 5\' 5"  (165.1 cm) Weight: 158 lb 9.6 oz (71.9 kg) IBW/kg (Calculated) : 57  Temp (24hrs), Avg:98.2 F (36.8 C), Min:97.7 F (36.5 C), Max:98.5 F (36.9 C)   Recent Labs Lab 11/15/15 1001 11/15/15 1540 11/16/15 0535  WBC 11.5*  --  6.3  CREATININE 0.76  --  0.65  LATICACIDVEN  --  1.0  --     Estimated Creatinine Clearance: 49.3 mL/min (by C-G formula based on SCr of 0.8 mg/dL).    Allergies  Allergen Reactions  . Erythromycin     Patient unsure of reaction    Antimicrobials this admission: Levaquin  8/3 >>     Dose adjustments this admission: 8/4 dose increased to 500mg  q24h for improved renal function  Microbiology results: 8/3 BCx:  8/3 UCx:    Thank you for allowing pharmacy to be a part of this patient's care.  Clovia Cuff, PharmD, BCPS 11/16/2015 10:18 AM

## 2015-11-16 NOTE — Clinical Social Work Note (Signed)
Clinical Social Work Assessment  Patient Details  Name: [place] Osborne MRN: [id number] Date of Birth: 09/11/1926  Date of referral:  11/16/15               Reason for consult:  Facility Placement                Permission sought to share information with:  Chartered certified accountant granted to share information::  Yes, Verbal Permission Granted  Name::      [place]::   [place]   Relationship::     Contact Information:     Housing/Transportation Living arrangements for the past 2 months:  [place] of Information:  Patient, Adult Children Patient Interpreter Needed:  None Criminal Activity/Legal Involvement Pertinent to Current Situation/Hospitalization:  No - Comment as needed Significant Relationships:  Adult Children Lives with:  Self Do you feel safe going back to the place where you live?  Yes Need for family participation in patient care:  Yes (Comment)  Care giving concerns:  Patient lives alone in [place]    Social Worker assessment / plan:  Holiday representative (CSW) received SNF consult. PT is recommending SNF. CSW met with patient alone at bedside to discuss D/C plan. Patient was alert and oriented and was sitting up in the chair. CSW introduced self and explained role of CSW department. Patient reported that she lives in [place] alone and her daughter [place] lives right down the street from her. Per patient she also has a daughter [place] that lives in [place] that she does not see very often. CSW explained that PT is recommending SNF. Patient is agreeable to SNF search and does not have a preference. Patient requested for CSW to call her daughter [place] to discuss D/C plan.   CSW contacted patient's daughter [place] to discuss D/C plan. CSW presented bed offers to [place] and she chose [place]. CSW also discussed long term care options with daughter including Medicaid and private pay. Daughter reported  that she could pay privately for maybe 1 or 2 months. CSW provided daughter with information on how to apply for Medicaid.   Plan is for patient to D/C to [place] on Sunday 11/18/15 pending medical clearance. Per [place] admissions coordinator at [place] patient will go to private room 325. RN will call report at [phone]. CSW sent D/C orders to [place] via [place] today. CSW will continue to follow and assist as needed.   Employment status:  Retired Forensic scientist:  Medicare PT Recommendations:  [place] / Referral to community resources:  [place]  Patient/Family's Response to care:  Patient and daughter [place] are agreeable for patient to go to [place].   Patient/Family's Understanding of and Emotional Response to Diagnosis, Current Treatment, and Prognosis:  Patient and daughter were pleasant and thanked CSW for visit. Daughter is realistic about patient's future progress and is considering long term care.   Emotional Assessment Appearance:  Appears stated age Attitude/Demeanor/Rapport:    Affect (typically observed):  Accepting, Adaptable, Pleasant Orientation:  Oriented to Self, Oriented to Place, Oriented to  Time, Oriented to Situation Alcohol / Substance use:  Not Applicable Psych involvement (Current and /or in the community):  No (Comment)  Discharge Needs  Concerns to be addressed:  Discharge Planning Concerns Readmission within the last 30 days:  No Current discharge risk:  Dependent with Mobility Barriers to Discharge:  Continued Medical Work up  [place], [place], LCSW 11/16/2015, 3:08 PM

## 2015-11-16 NOTE — Progress Notes (Signed)
Physical Therapy Treatment Patient Details Name: [place] MRN: [id number] DOB: Aug 19, 1926 Today's Date: 11/16/2015    History of Present Illness 82 y/o female here after fall with R hip fx needing ORIF.  Pt with some baseline dementia and depression.     PT Comments    Pt is able to "ambulate" a little better this afternoon, but continues to be very hesitant and pain limited with WBing on the R.  She shows very good effort t/o the session and despite needing miod/max assist to shift weight forward to get to standing.  She is eager to work with PT and despite pain shows good effort with all requested acts.   Follow Up Recommendations  SNF     Equipment Recommendations       Recommendations for Other Services       Precautions / Restrictions Precautions Precautions: Fall Restrictions RLE Weight Bearing: Weight bearing as tolerated    Mobility  Bed Mobility Overal bed mobility: Needs Assistance Bed Mobility: Sit to Supine       Sit to supine: Mod assist;Max assist   General bed mobility comments: Pt struggles to get LEs up into bed and does have pain with even very guarded assist up into bed  Transfers Overall transfer level: Needs assistance Equipment used: Rolling walker (2 wheeled) Transfers: Sit to/from Stand Sit to Stand: Mod assist;Max assist         General transfer comment: Pt has a harder time this afternoon rising from lower recliner.  She is unable to get her weight forward enough to push up and needs heavier assist with   Ambulation/Gait Ambulation/Gait assistance: Mod assist Ambulation Distance (Feet): 5 Feet Assistive device: Rolling walker (2 wheeled)       General Gait Details: Pt continues to struggle with weight bearing on the R but does seem to have a little less pain and/or better ability to use walker to assist.    Stairs            Wheelchair Mobility    Modified Rankin (Stroke Patients Only)       Balance                                     Cognition Arousal/Alertness: Awake/alert Behavior During Therapy: WFL for tasks assessed/performed Overall Cognitive Status: Within Functional Limits for tasks assessed                      Exercises General Exercises - Lower Extremity Ankle Circles/Pumps: Right;10 reps;AROM Quad Sets: Strengthening;10 reps;Right Gluteal Sets: Strengthening;Right;10 reps Short Arc Quad: AAROM;Strengthening;10 reps Heel Slides: AAROM;10 reps;Right Hip ABduction/ADduction: Right;AAROM;5 reps    General Comments        Pertinent Vitals/Pain Pain Assessment: 0-10 Pain Score: 6     Home Living                      Prior Function            PT Goals (current goals can now be found in the care plan section) Progress towards PT goals: Progressing toward goals    Frequency  BID    PT Plan Current plan remains appropriate    Co-evaluation             End of Session Equipment Utilized During Treatment: Gait belt Activity Tolerance: Patient limited by pain Patient left: with call  bell/phone within reach;with chair alarm set     Time: 4481-8563 PT Time Calculation (min) (ACUTE ONLY): 28 min  Charges:  $Gait Training: 8-22 mins $Therapeutic Exercise: 8-22 mins                    G Codes:      Malachi Pro, DPT 11/16/2015, 4:15 PM

## 2015-11-16 NOTE — Discharge Summary (Addendum)
[facility] [place] at [facility]   PATIENT NAME: Virginia Osborne    MR#:  [id number]  DATE OF BIRTH:  1926/07/30  DATE OF ADMISSION:  11/15/2015 ADMITTING PHYSICIAN: Milagros Loll, MD  DATE OF DISCHARGE: 11/18/2015  PRIMARY CARE PHYSICIAN: Margaretann Loveless, PA-C    ADMISSION DIAGNOSIS:  Abrasions of multiple sites [T14.8] Multiple contusions [T14.8] Contusion of face, initial encounter [S00.83XA] Hip fracture, right, closed, initial encounter (HCC) [S72.001A] Altered mental status, unspecified altered mental status type [R41.82] Pneumonia involving left lung, unspecified part of lung [J18.9]  DISCHARGE DIAGNOSIS:  Active Problems:   Hip fracture (HCC)   SECONDARY DIAGNOSIS:   Past Medical History:  Diagnosis Date  . Constipation   . Dementia   . Depression   . GERD (gastroesophageal reflux disease)   . Thyroid disease     HOSPITAL COURSE:  82 year old female with a history of mild dementia who presented status post mechanical fall and unfortunate suffered a right hip fracture.  1. Nondisplaced intertrochanteric fracture, right hip: Patient is postop day #3 and doing well. PT consult is recommending skilled nursing facility DVT prophylaxis as per orthopedic surgery With Lovenox for 14 days. Staples can be removed in 10 days. Follow-up with orthopedic surgery in 4 weeks.. Patient did not require blood transfusion during hospital stay.  2. Community acquired pneumonia with acute hypoxic respiratory failure: Continue Levaquin. She has been weaned off of oxygen.  3. Mild dementia: Continue to monitor for inpatient delirium. 4. Depression: Continue Paxil.    DISCHARGE CONDITIONS AND DIET:   Stable For discharge. Regular diet  CONSULTS OBTAINED:  Treatment Team:  Christena Flake, MD  DRUG ALLERGIES:   Allergies  Allergen Reactions  . Erythromycin     Patient unsure of reaction    DISCHARGE MEDICATIONS:   Current Discharge Medication  List    START taking these medications   Details  enoxaparin (LOVENOX) 40 MG/0.4ML injection Inject 0.4 mLs (40 mg total) into the skin daily. Qty: 0.4 mL, Refills: 0    levofloxacin (LEVAQUIN) 500 MG tablet Take 1 tablet (500 mg total) by mouth daily. Qty: 3 tablet, Refills: 0    magnesium hydroxide (MILK OF MAGNESIA) 400 MG/5ML suspension Take 30 mLs by mouth daily as needed for mild constipation. Qty: 360 mL, Refills: 0    oxyCODONE (OXY IR/ROXICODONE) 5 MG immediate release tablet Take 1-2 tablets (5-10 mg total) by mouth every 3 (three) hours as needed for breakthrough pain. Qty: 30 tablet, Refills: 0      CONTINUE these medications which have NOT CHANGED   Details  levothyroxine (SYNTHROID, LEVOTHROID) 112 MCG tablet Take 112 mcg by mouth daily before breakfast.    omeprazole (PRILOSEC) 20 MG capsule Take 20 mg by mouth daily.    PARoxetine (PAXIL) 30 MG tablet Take 30 mg by mouth at bedtime.    POLYETHYLENE GLYCOL 3350 PO Take 17 g by mouth as needed.     traZODone (DESYREL) 150 MG tablet Take 300 mg by mouth every evening.      STOP taking these medications     Naproxen Sod-Diphenhydramine (ALEVE PM) 220-25 MG TABS               Today   CHIEF COMPLAINT:  No issues overnight Ready for discharge   VITAL SIGNS:  Blood pressure (!) 140/50, pulse 84, temperature 98.2 F (36.8 C), temperature source Oral, resp. rate 18, height 5\' 5"  (1.651 m), weight 74.5 kg (164 lb 4.8 oz), SpO2 99 %.  REVIEW OF SYSTEMS:  Review of Systems  Constitutional: Negative.  Negative for chills, fever and malaise/fatigue.  HENT: Negative.  Negative for ear discharge, ear pain, hearing loss, nosebleeds and sore throat.   Eyes: Negative.  Negative for blurred vision and pain.  Respiratory: Negative.  Negative for cough, hemoptysis, shortness of breath and wheezing.   Cardiovascular: Negative.  Negative for chest pain, palpitations and leg swelling.  Gastrointestinal: Negative.   Negative for abdominal pain, blood in stool, diarrhea, nausea and vomiting.  Genitourinary: Negative.  Negative for dysuria.  Musculoskeletal: Negative.  Negative for back pain.  Skin: Negative.   Neurological: Negative for dizziness, tremors, speech change, focal weakness, seizures and headaches.  Endo/Heme/Allergies: Negative.  Does not bruise/bleed easily.  Psychiatric/Behavioral: Positive for memory loss. Negative for depression, hallucinations and suicidal ideas.     PHYSICAL EXAMINATION:  GENERAL:  82 y.o.-year-old patient lying in the bed with no acute distress.  NECK:  Supple, no jugular venous distention. No thyroid enlargement, no tenderness.  LUNGS: Normal breath sounds bilaterally, no wheezing, rales,rhonchi  No use of accessory muscles of respiration.  CARDIOVASCULAR: S1, S2 normal. No murmurs, rubs, or gallops.  ABDOMEN: Soft, non-tender, non-distended. Bowel sounds present. No organomegaly or mass.  EXTREMITIES: No pedal edema, cyanosis, or clubbing.  PSYCHIATRIC: The patient is alert and oriented x 3.  SKIN: No obvious rash, lesion, or ulcer.   DATA REVIEW:   CBC  Recent Labs Lab 11/18/15 0439  WBC 8.2  HGB 9.8*  HCT 27.8*  PLT 193    Chemistries   Recent Labs Lab 11/18/15 0439  NA 137  K 4.2  CL 104  CO2 26  GLUCOSE 114*  BUN 14  CREATININE 0.74  CALCIUM 8.3*    Cardiac Enzymes No results for input(s): TROPONINI in the last 168 hours.  Microbiology Results  @  RADIOLOGY:  Dg Chest 1 View  Result Date: 11/16/2015 CLINICAL DATA:  Pneumonia EXAM: CHEST 1 VIEW COMPARISON:  11/15/2015 FINDINGS: Right basilar airspace opacity again noted, similar to prior study. Previously seen opacity in the left base has improved, nearly completely resolved. No visible effusions. Heart is borderline in size. Atherosclerotic calcifications in the aortic arch. IMPRESSION: Stable right basilar atelectasis or infiltrate. Near complete resolution of the  left lower lobe opacity. Aortic atherosclerosis. Electronically Signed   By: Charlett Nose M.D.   On: 11/16/2015 09:03      Management plans discussed with the patient and she is in agreement. Stable for discharge SNF  Patient should follow up with ORTHO  CODE STATUS:     Code Status Orders        Start     Ordered   11/15/15 1639  Full code  Continuous     11/15/15 1640    Code Status History    Date Active Date Inactive Code Status Order ID Comments User Context   This patient has a current code status but no historical code status.    Advance Directive Documentation   Flowsheet Row Most Recent Value  Type of Advance Directive  Healthcare Power of Attorney  Pre-existing out of facility DNR order (yellow form or pink MOST form)  No data  "MOST" Form in Place?  No data      TOTAL TIME TAKING CARE OF THIS PATIENT: 35 minutes.    Note: This dictation was prepared with Dragon dictation along with smaller phrase technology. Any transcriptional errors that result from this process are unintentional.  Tyronn Golda M.D on  11/18/2015 at 8:47 AM  Between 7am to 6pm - Pager - [phone] After 6pm go to [email or web address] - Social research officer, government  [facility]  Office  [phone]  CC: Primary care physician; Margaretann Loveless, PA-C

## 2015-11-16 NOTE — Care Management Note (Signed)
Case Management Note  Patient Details  Name: Virginia Osborne MRN: [id number] Date of Birth: 1926-07-21  Subjective/Objective:       Spoke with patient today who is somewhat confused but able to answer some questions.  She stated that she lives at home alone and that she has a daughter that lives 2 blocks away. Daughters name is Olegario Messier Patient stated that she fell while taking out the garbage.     She stated that she lives in  [place]. She uses a cane at home but stated that she does have a rolling walker.  She stated that she has a paid aid that comes to her home once weekly. Denies wearing O2 at home but is on O2 here. Pt has evaluated patient and recommended SNF placement.          Action/Plan: SNF at this time will continue to follow.   Expected Discharge Date:                  Expected Discharge Plan:  Skilled Nursing Facility  In-House Referral:  Clinical Social Work  Discharge planning Services  CM Consult  Post Acute Care Choice:    Choice offered to:  NA  DME Arranged:    DME Agency:     HH Arranged:  NA HH Agency:     Status of Service:  In process, will continue to follow  If discussed at Long Length of Stay Meetings, dates discussed:    Additional Comments:  Adonis Huguenin, RN 11/16/2015, 10:50 AM

## 2015-11-16 NOTE — Progress Notes (Signed)
Subjective: 1 Day Post-Op Procedure(s) (LRB): INTRAMEDULLARY (IM) NAIL INTERTROCHANTRIC (Right) Patient reports pain as mild.   Patient is well, and has had no acute complaints or problems Care Management and PT to assist with discharge Negative for chest pain and shortness of breath Fever: no Gastrointestinal:Negative for nausea and vomiting  Objective: Vital signs in last 24 hours: Temp:  [97.7 F (36.5 C)-98.5 F (36.9 C)] 98.1 F (36.7 C) (08/04 0424) Pulse Rate:  [72-108] 85 (08/04 0424) Resp:  [12-27] 18 (08/04 0424) BP: (100-170)/(43-80) 122/45 (08/04 0424) SpO2:  [89 %-99 %] 98 % (08/04 0424) Weight:  [69.9 kg (154 lb)-71.9 kg (158 lb 9.6 oz)] 71.9 kg (158 lb 9.6 oz) (08/04 0256)  Intake/Output from previous day:  Intake/Output Summary (Last 24 hours) at 11/16/15 0755 Last data filed at 11/16/15 0617  Gross per 24 hour  Intake          1698.75 ml  Output              430 ml  Net          1268.75 ml    Intake/Output this shift: No intake/output data recorded.  Labs:  Recent Labs  11/15/15 1001 11/16/15 0535  HGB 15.5 10.8*    Recent Labs  11/15/15 1001 11/16/15 0535  WBC 11.5* 6.3  RBC 4.73 3.17*  HCT 44.4 30.3*  PLT 216 163    Recent Labs  11/15/15 1001 11/16/15 0535  NA 135 137  K 3.9 3.8  CL 101 107  CO2 26 28  BUN 13 10  CREATININE 0.76 0.65  GLUCOSE 153* 121*  CALCIUM 9.4 7.7*   No results for input(s): LABPT, INR in the last 72 hours.   EXAM General - Patient is Alert, Appropriate and Oriented Extremity - ABD soft Sensation intact distally Intact pulses distally Dorsiflexion/Plantar flexion intact Incision: dressing C/D/I No cellulitis present Dressing/Incision - clean, dry, no drainage Motor Function - intact, moving foot and toes well on exam.   Negative Homan's to bilateral lower extremities.  Past Medical History:  Diagnosis Date  . Constipation   . Dementia   . Depression   . GERD (gastroesophageal reflux  disease)   . Thyroid disease     Assessment/Plan: 1 Day Post-Op Procedure(s) (LRB): INTRAMEDULLARY (IM) NAIL INTERTROCHANTRIC (Right) Active Problems:   Hip fracture (HCC)  Estimated body mass index is 26.39 kg/m as calculated from the following:   Height as of this encounter: 5\' 5"  (1.651 m).   Weight as of this encounter: 71.9 kg (158 lb 9.6 oz). Advance diet Up with therapy D/C IV fluids when tolerating PO intake.  Labs reviewed, Hg stable at 10.8 Levaquin for pneumonia Up with PT. Pt will need to have BM prior to discharge.  DVT Prophylaxis - Lovenox, Foot Pumps and TED hose Weight-Bearing as tolerated to right leg  J. Horris Latino, PA-C [facility] Surgery 11/16/2015, 7:55 AM

## 2015-11-16 NOTE — Consult Note (Signed)
   [facility] CM Inpatient Consult   11/16/2015  CARNISHA RAIKES May 27, 1926 [id number]  Patient screened for potential [facility] Care Management services. Patient is eligible for [facility] Care Management Services. Electronic medical record reveals patient's discharge plan is SNF. [facility] Care Management services not appropriate at this time. If patient's post hospital needs change please place a [facility] Care Management consult. For questions please contact:   Zyona Pettaway RN, BSN [facility] Liaison  [phone]) Business Mobile [phone]) Toll free office

## 2015-11-16 NOTE — [place] FL2 (Signed)
[place] MEDICAID FL2 LEVEL OF CARE SCREENING TOOL     IDENTIFICATION  Patient Name: Virginia Osborne Birthdate: 1927/01/09 Sex: female Admission Date (Current Location): 11/15/2015  [place] and [place] Number:  Chiropodist and Address:  [facility], [address] [place], [place] [address]      Provider Number: [id number]  Attending Physician Name and Address:  Adrian Saran, MD  Relative Name and Phone Number:       Current Level of Care: Hospital Recommended Level of Care: Skilled Nursing Facility Prior Approval Number:    Date Approved/Denied:   PASRR Number:  [id number])  Discharge Plan: SNF    Current Diagnoses: Patient Active Problem List   Diagnosis Date Noted  . Hip fracture (HCC) 11/15/2015  . Abnormal blood sugar 10/10/2014  . B12 deficiency 10/10/2014  . Avitaminosis D 01/31/2009  . Deficiency of vitamin B 10/02/2008  . Cannot sleep 01/19/2008  . Clinical depression 11/03/2007  . Acid reflux 11/03/2007  . Hypercholesteremia 11/03/2007  . Adult hypothyroidism 11/03/2007    Orientation RESPIRATION BLADDER Height & Weight     Self, Time  O2 (2 Liters Oxygen ) Continent Weight: 158 lb 9.6 oz (71.9 kg) Height:  5\' 5"  (165.1 cm)  BEHAVIORAL SYMPTOMS/MOOD NEUROLOGICAL BOWEL NUTRITION STATUS   (none )  (none ) Continent Diet (Diet: Regular )  AMBULATORY STATUS COMMUNICATION OF NEEDS Skin   Extensive Assist Verbally Surgical wounds (Incision: Right Leg )                       Personal Care Assistance Level of Assistance  Bathing, Feeding, Dressing Bathing Assistance: Limited assistance Feeding assistance: Independent Dressing Assistance: Limited assistance     Functional Limitations Info  Sight, Hearing, Speech Sight Info: Impaired Hearing Info: Adequate Speech Info: Adequate    SPECIAL CARE FACTORS FREQUENCY  PT (By licensed PT), OT (By licensed OT)     PT Frequency:  (5) OT Frequency:  (5)           Contractures      Additional Factors Info  Code Status, Allergies Code Status Info:  (Full Code. ) Allergies Info:  (Erythromycin)           Current Medications (11/16/2015):  This is the current hospital active medication list Current Facility-Administered Medications  Medication Dose Route Frequency Provider Last Rate Last Dose  . acetaminophen (TYLENOL) tablet 650 mg  650 mg Oral Q6H PRN Christena Flake, MD       Or  . acetaminophen (TYLENOL) suppository 650 mg  650 mg Rectal Q6H PRN Christena Flake, MD      . acetaminophen (TYLENOL) tablet 1,000 mg  1,000 mg Oral Q6H Christena Flake, MD   1,000 mg at 11/16/15 0617  . albuterol (PROVENTIL) (2.5 MG/3ML) 0.083% nebulizer solution 2.5 mg  2.5 mg Nebulization Q2H PRN Srikar Sudini, MD      . bisacodyl (DULCOLAX) suppository 10 mg  10 mg Rectal Daily PRN Christena Flake, MD      . ceFAZolin (ANCEF) IVPB 2g/100 mL premix  2 g Intravenous Q6H Christena Flake, MD   2 g at 11/16/15 0617  . dextrose 5 % and 0.9 % NaCl with KCl 20 mEq/L infusion   Intravenous Continuous Christena Flake, MD 75 mL/hr at 11/16/15 0041    . diphenhydrAMINE (BENADRYL) 12.5 MG/5ML elixir 12.5-25 mg  12.5-25 mg Oral Q4H PRN Christena Flake, MD      .  docusate sodium (COLACE) capsule 100 mg  100 mg Oral BID Christena Flake, MD   100 mg at 11/16/15 0949  . enoxaparin (LOVENOX) injection 40 mg  40 mg Subcutaneous Q24H Christena Flake, MD   40 mg at 11/16/15 0950  . HYDROmorphone (DILAUDID) injection 0.5-1 mg  0.5-1 mg Intravenous Q2H PRN Christena Flake, MD      . levofloxacin (LEVAQUIN) IVPB 500 mg  500 mg Intravenous Q24H Foye Deer, RPH      . levothyroxine (SYNTHROID, LEVOTHROID) tablet 112 mcg  112 mcg Oral QAC breakfast Srikar Sudini, MD      . magnesium hydroxide (MILK OF MAGNESIA) suspension 30 mL  30 mL Oral Daily PRN Christena Flake, MD      . metoCLOPramide (REGLAN) tablet 5-10 mg  5-10 mg Oral Q8H PRN Christena Flake, MD       Or  . metoCLOPramide (REGLAN) injection 5-10 mg  5-10 mg  Intravenous Q8H PRN Christena Flake, MD      . ondansetron [facility]) injection 4 mg  4 mg Intravenous Q6H PRN Srikar Sudini, MD      . oxyCODONE (Oxy IR/ROXICODONE) immediate release tablet 5-10 mg  5-10 mg Oral Q3H PRN Christena Flake, MD   5 mg at 11/16/15 0949  . pantoprazole (PROTONIX) EC tablet 40 mg  40 mg Oral Daily Milagros Loll, MD   40 mg at 11/16/15 0949  . PARoxetine (PAXIL) tablet 30 mg  30 mg Oral QHS Milagros Loll, MD   30 mg at 11/15/15 2254  . polyethylene glycol (MIRALAX / GLYCOLAX) packet 17 g  17 g Oral Daily PRN Srikar Sudini, MD      . polyethylene glycol (MIRALAX / GLYCOLAX) packet 17 g  1 packet Oral Daily Milagros Loll, MD   17 g at 11/16/15 0950  . sodium phosphate (FLEET) 7-19 GM/118ML enema 1 enema  1 enema Rectal Once PRN Christena Flake, MD      . traZODone (DESYREL) tablet 300 mg  300 mg Oral QPM Milagros Loll, MD         Discharge Medications: Please see discharge summary for a list of discharge medications.  Relevant Imaging Results:  Relevant Lab Results:   Additional Information  (SSN: [id number])  Sharia Averitt, Darleen Crocker, LCSW

## 2015-11-16 NOTE — Progress Notes (Signed)
[employer] [facility] [place] [facility]   PATIENT NAME: Virginia Osborne    MR#:  [id number]  DATE OF BIRTH:  12-Jun-1926  SUBJECTIVE:   Patient doing well this morning. Not complaining of pain.  REVIEW OF SYSTEMS:    Review of Systems  Constitutional: Negative.  Negative for chills, fever and malaise/fatigue.  HENT: Negative.  Negative for ear discharge, ear pain, hearing loss, nosebleeds and sore throat.   Eyes: Negative.  Negative for blurred vision and pain.  Respiratory: Negative.  Negative for cough, hemoptysis, shortness of breath and wheezing.   Cardiovascular: Negative.  Negative for chest pain, palpitations and leg swelling.  Gastrointestinal: Negative.  Negative for abdominal pain, blood in stool, diarrhea, nausea and vomiting.  Genitourinary: Negative.  Negative for dysuria.  Musculoskeletal: Negative.  Negative for back pain.  Skin: Negative.   Neurological: Negative for dizziness, tremors, speech change, focal weakness, seizures and headaches.  Endo/Heme/Allergies: Negative.  Does not bruise/bleed easily.  Psychiatric/Behavioral: Negative.  Negative for depression, hallucinations and suicidal ideas.    Tolerating Diet:Yes      DRUG ALLERGIES:   Allergies  Allergen Reactions  . Erythromycin     Patient unsure of reaction    VITALS:  Blood pressure (!) 115/46, pulse 79, temperature 98.4 F (36.9 C), temperature source Oral, resp. rate 18, height 5\' 5"  (1.651 m), weight 71.9 kg (158 lb 9.6 oz), SpO2 96 %.  PHYSICAL EXAMINATION:   Physical Exam  Constitutional: She is oriented to person, place, and time and well-developed, well-nourished, and in no distress. No distress.  HENT:  Head: Normocephalic.  Eyes: No scleral icterus.  Neck: Normal range of motion. Neck supple. No JVD present. No tracheal deviation present.  Cardiovascular: Normal rate, regular rhythm and normal heart sounds.  Exam reveals no gallop and no friction rub.   No murmur  heard. Pulmonary/Chest: Effort normal and breath sounds normal. No respiratory distress. She has no wheezes. She has no rales. She exhibits no tenderness.  Abdominal: Soft. Bowel sounds are normal. She exhibits no distension and no mass. There is no tenderness. There is no rebound and no guarding.  Musculoskeletal: Normal range of motion. She exhibits no edema.  Neurological: She is alert and oriented to person, place, and time.  Skin: Skin is warm. No rash noted. No erythema.  Psychiatric: Affect and judgment normal.      LABORATORY PANEL:   CBC  Recent Labs Lab 11/16/15 0535  WBC 6.3  HGB 10.8*  HCT 30.3*  PLT 163   ------------------------------------------------------------------------------------------------------------------  Chemistries   Recent Labs Lab 11/16/15 0535  NA 137  K 3.8  CL 107  CO2 28  GLUCOSE 121*  BUN 10  CREATININE 0.65  CALCIUM 7.7*   ------------------------------------------------------------------------------------------------------------------  Cardiac Enzymes No results for input(s): TROPONINI in the last 168 hours. ------------------------------------------------------------------------------------------------------------------  RADIOLOGY:  Dg Chest 1 View  Result Date: 11/16/2015 CLINICAL DATA:  Pneumonia EXAM: CHEST 1 VIEW COMPARISON:  11/15/2015 FINDINGS: Right basilar airspace opacity again noted, similar to prior study. Previously seen opacity in the left base has improved, nearly completely resolved. No visible effusions. Heart is borderline in size. Atherosclerotic calcifications in the aortic arch. IMPRESSION: Stable right basilar atelectasis or infiltrate. Near complete resolution of the left lower lobe opacity. Aortic atherosclerosis. Electronically Signed   By: Charlett Nose M.D.   On: 11/16/2015 09:03   Dg Chest 1 View  Result Date: 11/15/2015 CLINICAL DATA:  Larey Seat this morning, fell yesterday as well, history dementia EXAM:  CHEST 1 VIEW COMPARISON:  09/03/2013 FINDINGS: Enlargement of cardiac silhouette with pulmonary vascular congestion. Atherosclerotic calcification aorta. RIGHT basilar atelectasis. Increased LEFT lower lobe opacity question atelectasis versus consolidation. Slight accentuation of interstitial markings versus previous exam which could reflect supine technique or minimal congestion. No pleural effusion or pneumothorax. Bones demineralized. Surgical clips RIGHT upper quadrant question cholecystectomy. IMPRESSION: Enlargement of cardiac silhouette with pulmonary vascular congestion with question minimal edema. RIGHT basilar atelectasis with new consolidation versus atelectasis in LEFT lower lobe. Electronically Signed   By: Ulyses Southward M.D.   On: 11/15/2015 13:15   Dg Elbow Complete Left  Result Date: 11/15/2015 CLINICAL DATA:  Patient fell yesterday. EXAM: LEFT ELBOW - COMPLETE 3+ VIEW COMPARISON:  None. FINDINGS: No evidence of fracture of the ulna or humerus. The radial head is normal. No joint effusion. IMPRESSION: No fracture or dislocation. Electronically Signed   By: Genevive Bi M.D.   On: 11/15/2015 13:15   Ct Head Wo Contrast  Result Date: 11/15/2015 CLINICAL DATA:  Status post fall this morning with a blow to the back of the head. Bruising about the nose and left eye. Initial encounter. EXAM: CT HEAD WITHOUT CONTRAST CT MAXILLOFACIAL WITHOUT CONTRAST CT CERVICAL SPINE WITHOUT CONTRAST TECHNIQUE: Multidetector CT imaging of the head, cervical spine, and maxillofacial structures were performed using the standard protocol without intravenous contrast. Multiplanar CT image reconstructions of the cervical spine and maxillofacial structures were also generated. COMPARISON:  None.  Head and cervical spine CT scans 09/01/2013. FINDINGS: CT HEAD FINDINGS Atrophy and chronic microvascular ischemic change are identified. No evidence of acute intracranial abnormality including hemorrhage, infarct, mass lesion,  mass effect, midline shift or abnormal extra-axial fluid collection. No hydrocephalus or pneumocephalus. The calvarium is intact. CT MAXILLOFACIAL FINDINGS No facial bone fracture is identified. The globes are intact and the lenses are located. Orbital fat is clear. The mandibular condyles are located. Paranasal sinuses and mastoid air cells are clear. CT CERVICAL SPINE FINDINGS No cervical spine fracture or malalignment is identified. Scattered facet degenerative change is noted. There is some loss of disc space height at C5-6 and C6-7. Lung apices are clear. IMPRESSION: No acute abnormality head, face or cervical spine. Atrophy and chronic microvascular ischemic change. Mild cervical spondylosis. Electronically Signed   By: Drusilla Kanner M.D.   On: 11/15/2015 11:44   Ct Cervical Spine Wo Contrast  Result Date: 11/15/2015 CLINICAL DATA:  Status post fall this morning with a blow to the back of the head. Bruising about the nose and left eye. Initial encounter. EXAM: CT HEAD WITHOUT CONTRAST CT MAXILLOFACIAL WITHOUT CONTRAST CT CERVICAL SPINE WITHOUT CONTRAST TECHNIQUE: Multidetector CT imaging of the head, cervical spine, and maxillofacial structures were performed using the standard protocol without intravenous contrast. Multiplanar CT image reconstructions of the cervical spine and maxillofacial structures were also generated. COMPARISON:  None.  Head and cervical spine CT scans 09/01/2013. FINDINGS: CT HEAD FINDINGS Atrophy and chronic microvascular ischemic change are identified. No evidence of acute intracranial abnormality including hemorrhage, infarct, mass lesion, mass effect, midline shift or abnormal extra-axial fluid collection. No hydrocephalus or pneumocephalus. The calvarium is intact. CT MAXILLOFACIAL FINDINGS No facial bone fracture is identified. The globes are intact and the lenses are located. Orbital fat is clear. The mandibular condyles are located. Paranasal sinuses and mastoid air cells  are clear. CT CERVICAL SPINE FINDINGS No cervical spine fracture or malalignment is identified. Scattered facet degenerative change is noted. There is some loss of disc space height at  C5-6 and C6-7. Lung apices are clear. IMPRESSION: No acute abnormality head, face or cervical spine. Atrophy and chronic microvascular ischemic change. Mild cervical spondylosis. Electronically Signed   By: Drusilla Kanner M.D.   On: 11/15/2015 11:44   Dg Knee Complete 4 Views Left  Result Date: 11/15/2015 CLINICAL DATA:  Larey Seat this morning with right-sided pain. EXAM: LEFT KNEE - COMPLETE 4+ VIEW COMPARISON:  None. FINDINGS: No evidence of fracture, dislocation, or large joint effusion. Small amount of fluid in the suprapatellar recess. No evidence of arthropathy or other focal bone abnormality. Soft tissues are unremarkable. IMPRESSION: Negative. Electronically Signed   By: Paulina Fusi M.D.   On: 11/15/2015 13:18   Dg Knee Complete 4 Views Right  Result Date: 11/15/2015 CLINICAL DATA:  Pain following fall EXAM: RIGHT KNEE - COMPLETE 4+ VIEW COMPARISON:  None. FINDINGS: Frontal, lateral, and bilateral oblique views were obtained. No fracture is evident. There is apparent lateral patellar subluxation without frank dislocation. There is a rather minimal joint effusion. There is mild joint space narrowing medially. There is mild spurring in all compartments. There are foci of chondrocalcinosis. IMPRESSION: Lateral patellar subluxation with minimal joint effusion. No fracture or frank dislocation. Relatively mild osteoarthritic change medially. Chondrocalcinosis is present, a finding that may be seen with osteoarthritis or calcium pyrophosphate deposition disease. Electronically Signed   By: Bretta Bang III M.D.   On: 11/15/2015 13:15   Dg Hip Operative Unilat W Or W/o Pelvis Right  Result Date: 11/16/2015 CLINICAL DATA:  Hip fracture EXAM: OPERATIVE RIGHT HIP (WITH PELVIS IF PERFORMED) 4 VIEWS TECHNIQUE: Fluoroscopic  spot image(s) were submitted for interpretation post-operatively. COMPARISON:  None. FINDINGS: Patient is status post open reduction internal fixation of a 2 part intertrochanteric fracture, with a compression screw and intra medullary rod. Satisfactory position and alignment. IMPRESSION: As above. Electronically Signed   By: Elsie Stain M.D.   On: 11/16/2015 07:38   Dg Hip Unilat W Or Wo Pelvis 2-3 Views Right  Result Date: 11/15/2015 CLINICAL DATA:  Larey Seat this morning.  Right hip pain. EXAM: DG HIP (WITH OR WITHOUT PELVIS) 2-3V RIGHT COMPARISON:  None. FINDINGS: Nondisplaced intertrochanteric fracture of the right femur. No other pelvic fracture. IMPRESSION: Nondisplaced intertrochanteric fracture of the right femur. Electronically Signed   By: Paulina Fusi M.D.   On: 11/15/2015 13:15   Ct Maxillofacial Wo Cm  Result Date: 11/15/2015 CLINICAL DATA:  Status post fall this morning with a blow to the back of the head. Bruising about the nose and left eye. Initial encounter. EXAM: CT HEAD WITHOUT CONTRAST CT MAXILLOFACIAL WITHOUT CONTRAST CT CERVICAL SPINE WITHOUT CONTRAST TECHNIQUE: Multidetector CT imaging of the head, cervical spine, and maxillofacial structures were performed using the standard protocol without intravenous contrast. Multiplanar CT image reconstructions of the cervical spine and maxillofacial structures were also generated. COMPARISON:  None.  Head and cervical spine CT scans 09/01/2013. FINDINGS: CT HEAD FINDINGS Atrophy and chronic microvascular ischemic change are identified. No evidence of acute intracranial abnormality including hemorrhage, infarct, mass lesion, mass effect, midline shift or abnormal extra-axial fluid collection. No hydrocephalus or pneumocephalus. The calvarium is intact. CT MAXILLOFACIAL FINDINGS No facial bone fracture is identified. The globes are intact and the lenses are located. Orbital fat is clear. The mandibular condyles are located. Paranasal sinuses and  mastoid air cells are clear. CT CERVICAL SPINE FINDINGS No cervical spine fracture or malalignment is identified. Scattered facet degenerative change is noted. There is some loss of disc space height at  C5-6 and C6-7. Lung apices are clear. IMPRESSION: No acute abnormality head, face or cervical spine. Atrophy and chronic microvascular ischemic change. Mild cervical spondylosis. Electronically Signed   By: Drusilla Kanner M.D.   On: 11/15/2015 11:44     ASSESSMENT AND PLAN:    83 year old female with a history of mild dementia who presented status post mechanical fall and unfortunate suffered a right hip fracture.  1. Nondisplaced intertrochanteric fracture, right hip: Patient is postop day #1 and doing quite well. PT consult is recommending skilled nursing facility DVT prophylaxis as per orthopedic surgery. Call social worker consult placed for discharge planning. Continue pain medication when necessary. Monitor hemoglobin. As of today no indication for blood transfusion.  2. Community acquired pneumonia: Continue Levaquin. Wean oxygen.  3. Mild dementia: Continue to monitor for inpatient delirium. 4. Depression: Continue Paxil.  Management plans discussed with the patient and she is in agreement.  CODE STATUS: full  TOTAL TIME TAKING CARE OF THIS PATIENT: 30 minutes.     POSSIBLE D/C sunday, DEPENDING ON CLINICAL CONDITION.   Ranon Coven M.D on 11/16/2015 at 12:21 PM  Between 7am to 6pm - Pager - [phone] After 6pm go to [email or web address] - password [email or web address] [facility]  [facility]  Office  [phone]  CC: Primary care physician; Margaretann Loveless, PA-C  Note: This dictation was prepared with Dragon dictation along with smaller phrase technology. Any transcriptional errors that result from this process are unintentional.

## 2015-11-16 NOTE — Evaluation (Signed)
Physical Therapy Evaluation Patient Details Name: Virginia Osborne MRN: [id number] DOB: 08/11/1926 Today's Date: 11/16/2015   History of Present Illness  82 y/o female here after fall with R hip fx needing ORIF.  Pt with some baseline dementia and depression.   Clinical Impression  Pt is pleasant t/o PT exam and shows good effort with ~10 minute apart from the eval, but ultimately she is very limited with mobility and especially ambulation/WBing.  She struggles to use the walker effectively and has a lot of pain with weight shift to the R.  She typically uses only a SPC and does not need O2; her O2 sats were in the low 90s on 2 liters and dropped into the 80s with the effort of brief standing/"walking" to the recliner.     Follow Up Recommendations SNF    Equipment Recommendations   (per progress at rehab)    Recommendations for Other Services       Precautions / Restrictions Precautions Precautions: Fall Restrictions RLE Weight Bearing: Weight bearing as tolerated      Mobility  Bed Mobility Overal bed mobility: Needs Assistance Bed Mobility: Supine to Sit     Supine to sit: Mod assist     General bed mobility comments: Pt shows good effort getting to EOB, needs assist with LEs and raising trunk  Transfers Overall transfer level: Needs assistance Equipment used: Rolling walker (2 wheeled) Transfers: Sit to/from Stand Sit to Stand: Min assist;Mod assist         General transfer comment: Pt initially struggles with getting to standing secondary to pain and weakness, she is able to get up with much cuing and encouragement but ultimately is weak and limited  Ambulation/Gait Ambulation/Gait assistance: Max assist;Mod assist Ambulation Distance (Feet): 3 Feet Assistive device: Rolling walker (2 wheeled)       General Gait Details: Pt c/o a lot of pain with any attempt at [facility] on the R, even with heavy cuing to use the walker and direct PT assist she is ineffective  with R WBing and struggles with weight shift.  Pt able to take a few small turning steps with excessive effort.   Stairs            Wheelchair Mobility    Modified Rankin (Stroke Patients Only)       Balance                                             Pertinent Vitals/Pain Pain Assessment: 0-10 Pain Score: 6     Home Living Family/patient expects to be discharged to:: Skilled nursing facility Living Arrangements: Alone               Additional Comments: Pt reports that daughter checks in QD and that she is rarely out of the house but is able to do some in-home ADLs.    Prior Function Level of Independence: Needs assistance   Gait / Transfers Assistance Needed: uses SPC for in home distances, rarely out of the house           Hand Dominance        Extremity/Trunk Assessment   Upper Extremity Assessment: Generalized weakness (grossly 3+ to 4-/5 b/l)           Lower Extremity Assessment: Generalized weakness (Pt able to do some AAROM exercises with R, +pain, L 4/5 t/o)  Communication   Communication:  (pleasant, minor confusion)  Cognition Arousal/Alertness: Awake/alert Behavior During Therapy: WFL for tasks assessed/performed Overall Cognitive Status: Within Functional Limits for tasks assessed (mild confusion)                      General Comments      Exercises General Exercises - Lower Extremity Ankle Circles/Pumps: Right;10 reps;AROM Quad Sets: Strengthening;10 reps;Right Gluteal Sets: Strengthening;Right;10 reps Heel Slides: AAROM;10 reps;Right Hip ABduction/ADduction: Right;AAROM;5 reps      Assessment/Plan    PT Assessment Patient needs continued PT services  PT Diagnosis Acute pain;Generalized weakness;Difficulty walking   PT Problem List Decreased strength;Decreased range of motion;Decreased activity tolerance;Decreased balance;Decreased mobility;Decreased coordination;Decreased safety  awareness;Decreased knowledge of use of DME;Decreased knowledge of precautions;Pain  PT Treatment Interventions DME instruction;Gait training;Functional mobility training;Therapeutic activities;Therapeutic exercise;Balance training;Patient/family education;Neuromuscular re-education;Cognitive remediation   PT Goals (Current goals can be found in the Care Plan section) Acute Rehab PT Goals Patient Stated Goal: walk better PT Goal Formulation: With patient Time For Goal Achievement: 11/30/15 Potential to Achieve Goals: Fair    Frequency BID   Barriers to discharge        Co-evaluation               End of Session Equipment Utilized During Treatment: Gait belt Activity Tolerance: Patient limited by pain Patient left: with call bell/phone within reach;with chair alarm set           Time: 1610-9604 PT Time Calculation (min) (ACUTE ONLY): 27 min   Charges:   PT Evaluation $PT Eval Low Complexity: 1 Procedure PT Treatments $Therapeutic Exercise: 8-22 mins   PT G Codes:        Malachi Pro, DPT 11/16/2015, 10:35 AM

## 2015-11-16 NOTE — Clinical Social Work Placement (Signed)
   CLINICAL SOCIAL WORK PLACEMENT  NOTE  Date:  11/16/2015  Patient Details  Name: SHARDEE BRIZENDINE MRN: [id number] Date of Birth: 1926/12/25  Clinical Social Work is seeking post-discharge placement for this patient at the Skilled  Nursing Facility level of care (*CSW will initial, date and re-position this form in  chart as items are completed):  Yes   Patient/family provided with [facility] Social Work Department's list of facilities offering this level of care within the geographic area requested by the patient (or if unable, by the patient's family).  Yes   Patient/family informed of their freedom to choose among providers that offer the needed level of care, that participate in Medicare, Medicaid or managed care program needed by the patient, have an available bed and are willing to accept the patient.  Yes   Patient/family informed of [facility]'s ownership interest in [facility] and [facility], as well as of the fact that they are under no obligation to receive care at these facilities.  PASRR submitted to EDS on 11/16/15     PASRR number received on 11/16/15     Existing PASRR number confirmed on       FL2 transmitted to all facilities in geographic area requested by pt/family on 11/16/15     FL2 transmitted to all facilities within larger geographic area on       Patient informed that his/her managed care company has contracts with or will negotiate with certain facilities, including the following:        Yes   Patient/family informed of bed offers received.  Patient chooses bed at  [facility] )     Physician recommends and patient chooses bed at      Patient to be transferred to   on  .  Patient to be transferred to facility by       Patient family notified on   of transfer.  Name of family member notified:        PHYSICIAN       Additional Comment:    _______________________________________________ Geneive Sandstrom, Darleen Crocker, LCSW 11/16/2015,  3:06 PM

## 2015-11-16 NOTE — Care Management Important Message (Signed)
Important Message  Patient Details  Name: Virginia Osborne MRN: [id number] Date of Birth: [birth year]   Medicare Important Message Given:  Yes    Adonis Huguenin, RN 11/16/2015, 10:47 AM

## 2015-11-17 LAB — CBC
HCT: 30.7 % — ABNORMAL LOW (ref 35.0–47.0)
Hemoglobin: 10.5 g/dL — ABNORMAL LOW (ref 12.0–16.0)
MCH: 33.4 pg (ref 26.0–34.0)
MCHC: 34.4 g/dL (ref 32.0–36.0)
MCV: 97.1 fL (ref 80.0–100.0)
PLATELETS: 182 10*3/uL (ref 150–440)
RBC: 3.16 MIL/uL — AB (ref 3.80–5.20)
RDW: 13.6 % (ref 11.5–14.5)
WBC: 8.7 10*3/uL (ref 3.6–11.0)

## 2015-11-17 LAB — BASIC METABOLIC PANEL
Anion gap: 3 — ABNORMAL LOW (ref 5–15)
BUN: 10 mg/dL (ref 6–20)
CALCIUM: 8 mg/dL — AB (ref 8.9–10.3)
CO2: 28 mmol/L (ref 22–32)
CREATININE: 0.83 mg/dL (ref 0.44–1.00)
Chloride: 107 mmol/L (ref 101–111)
Glucose, Bld: 126 mg/dL — ABNORMAL HIGH (ref 65–99)
Potassium: 4 mmol/L (ref 3.5–5.1)
SODIUM: 138 mmol/L (ref 135–145)

## 2015-11-17 MED ORDER — SENNA 8.6 MG PO TABS
1.0000 | ORAL_TABLET | Freq: Every day | ORAL | Status: DC
  Administered 2015-11-17 – 2015-11-18 (×2): 8.6 mg via ORAL
  Filled 2015-11-17 (×2): qty 1

## 2015-11-17 MED ORDER — POLYETHYLENE GLYCOL 3350 17 G PO PACK: 17.0000 g | PACK | Freq: Every day | ORAL | Status: DC

## 2015-11-17 NOTE — Progress Notes (Signed)
[employer] - [place] at [facility]   PATIENT NAME: Virginia Osborne    MR#:  [id number]  DATE OF BIRTH:  03/31/1927  SUBJECTIVE:   No acute events overnight. She has worked with physical therapy and they're recommending skilled nursing facility.  REVIEW OF SYSTEMS:    Review of Systems  Constitutional: Negative.  Negative for chills, fever and malaise/fatigue.  HENT: Negative.  Negative for ear discharge, ear pain, hearing loss, nosebleeds and sore throat.   Eyes: Negative.  Negative for blurred vision and pain.  Respiratory: Negative.  Negative for cough, hemoptysis, shortness of breath and wheezing.   Cardiovascular: Negative.  Negative for chest pain, palpitations and leg swelling.  Gastrointestinal: Negative.  Negative for abdominal pain, blood in stool, diarrhea, nausea and vomiting.  Genitourinary: Negative.  Negative for dysuria.  Musculoskeletal: Negative.  Negative for back pain.  Skin: Negative.   Neurological: Negative for dizziness, tremors, speech change, focal weakness, seizures and headaches.  Endo/Heme/Allergies: Negative.  Does not bruise/bleed easily.  Psychiatric/Behavioral: Negative.  Negative for depression, hallucinations and suicidal ideas.    Tolerating Diet:Yes      DRUG ALLERGIES:   Allergies  Allergen Reactions  . Erythromycin     Patient unsure of reaction    VITALS:  Blood pressure (!) 120/50, pulse 88, temperature 98.7 F (37.1 C), temperature source Oral, resp. rate 16, height 5\' 5"  (1.651 m), weight 74.5 kg (164 lb 4.8 oz), SpO2 93 %.  PHYSICAL EXAMINATION:   Physical Exam  Constitutional: She is oriented to person, place, and time and well-developed, well-nourished, and in no distress. No distress.  HENT:  Head: Normocephalic.  Eyes: No scleral icterus.  Neck: Normal range of motion. Neck supple. No JVD present. No tracheal deviation present.  Cardiovascular: Normal rate, regular rhythm and normal heart sounds.   Exam reveals no gallop and no friction rub.   No murmur heard. Pulmonary/Chest: Effort normal and breath sounds normal. No respiratory distress. She has no wheezes. She has no rales. She exhibits no tenderness.  Abdominal: Soft. Bowel sounds are normal. She exhibits no distension and no mass. There is no tenderness. There is no rebound and no guarding.  Musculoskeletal: Normal range of motion. She exhibits no edema.  Neurological: She is alert and oriented to person, place, and time.  Skin: Skin is warm. No rash noted. No erythema.  Psychiatric: Affect and judgment normal.      LABORATORY PANEL:   CBC  Recent Labs Lab 11/17/15 0400  WBC 8.7  HGB 10.5*  HCT 30.7*  PLT 182   ------------------------------------------------------------------------------------------------------------------  Chemistries   Recent Labs Lab 11/17/15 0400  NA 138  K 4.0  CL 107  CO2 28  GLUCOSE 126*  BUN 10  CREATININE 0.83  CALCIUM 8.0*   ------------------------------------------------------------------------------------------------------------------  Cardiac Enzymes No results for input(s): TROPONINI in the last 168 hours. ------------------------------------------------------------------------------------------------------------------  RADIOLOGY:  Dg Chest 1 View  Result Date: 11/16/2015 CLINICAL DATA:  Pneumonia EXAM: CHEST 1 VIEW COMPARISON:  11/15/2015 FINDINGS: Right basilar airspace opacity again noted, similar to prior study. Previously seen opacity in the left base has improved, nearly completely resolved. No visible effusions. Heart is borderline in size. Atherosclerotic calcifications in the aortic arch. IMPRESSION: Stable right basilar atelectasis or infiltrate. Near complete resolution of the left lower lobe opacity. Aortic atherosclerosis. Electronically Signed   By: Charlett Nose M.D.   On: 11/16/2015 09:03   Dg Chest 1 View  Result Date: 11/15/2015 CLINICAL DATA:  Larey Seat this  morning, fell yesterday as well, history dementia EXAM: CHEST 1 VIEW COMPARISON:  09/03/2013 FINDINGS: Enlargement of cardiac silhouette with pulmonary vascular congestion. Atherosclerotic calcification aorta. RIGHT basilar atelectasis. Increased LEFT lower lobe opacity question atelectasis versus consolidation. Slight accentuation of interstitial markings versus previous exam which could reflect supine technique or minimal congestion. No pleural effusion or pneumothorax. Bones demineralized. Surgical clips RIGHT upper quadrant question cholecystectomy. IMPRESSION: Enlargement of cardiac silhouette with pulmonary vascular congestion with question minimal edema. RIGHT basilar atelectasis with new consolidation versus atelectasis in LEFT lower lobe. Electronically Signed   By: Ulyses Southward M.D.   On: 11/15/2015 13:15   Dg Elbow Complete Left  Result Date: 11/15/2015 CLINICAL DATA:  Patient fell yesterday. EXAM: LEFT ELBOW - COMPLETE 3+ VIEW COMPARISON:  None. FINDINGS: No evidence of fracture of the ulna or humerus. The radial head is normal. No joint effusion. IMPRESSION: No fracture or dislocation. Electronically Signed   By: Genevive Bi M.D.   On: 11/15/2015 13:15   Ct Head Wo Contrast  Result Date: 11/15/2015 CLINICAL DATA:  Status post fall this morning with a blow to the back of the head. Bruising about the nose and left eye. Initial encounter. EXAM: CT HEAD WITHOUT CONTRAST CT MAXILLOFACIAL WITHOUT CONTRAST CT CERVICAL SPINE WITHOUT CONTRAST TECHNIQUE: Multidetector CT imaging of the head, cervical spine, and maxillofacial structures were performed using the standard protocol without intravenous contrast. Multiplanar CT image reconstructions of the cervical spine and maxillofacial structures were also generated. COMPARISON:  None.  Head and cervical spine CT scans 09/01/2013. FINDINGS: CT HEAD FINDINGS Atrophy and chronic microvascular ischemic change are identified. No evidence of acute intracranial  abnormality including hemorrhage, infarct, mass lesion, mass effect, midline shift or abnormal extra-axial fluid collection. No hydrocephalus or pneumocephalus. The calvarium is intact. CT MAXILLOFACIAL FINDINGS No facial bone fracture is identified. The globes are intact and the lenses are located. Orbital fat is clear. The mandibular condyles are located. Paranasal sinuses and mastoid air cells are clear. CT CERVICAL SPINE FINDINGS No cervical spine fracture or malalignment is identified. Scattered facet degenerative change is noted. There is some loss of disc space height at C5-6 and C6-7. Lung apices are clear. IMPRESSION: No acute abnormality head, face or cervical spine. Atrophy and chronic microvascular ischemic change. Mild cervical spondylosis. Electronically Signed   By: Drusilla Kanner M.D.   On: 11/15/2015 11:44   Ct Cervical Spine Wo Contrast  Result Date: 11/15/2015 CLINICAL DATA:  Status post fall this morning with a blow to the back of the head. Bruising about the nose and left eye. Initial encounter. EXAM: CT HEAD WITHOUT CONTRAST CT MAXILLOFACIAL WITHOUT CONTRAST CT CERVICAL SPINE WITHOUT CONTRAST TECHNIQUE: Multidetector CT imaging of the head, cervical spine, and maxillofacial structures were performed using the standard protocol without intravenous contrast. Multiplanar CT image reconstructions of the cervical spine and maxillofacial structures were also generated. COMPARISON:  None.  Head and cervical spine CT scans 09/01/2013. FINDINGS: CT HEAD FINDINGS Atrophy and chronic microvascular ischemic change are identified. No evidence of acute intracranial abnormality including hemorrhage, infarct, mass lesion, mass effect, midline shift or abnormal extra-axial fluid collection. No hydrocephalus or pneumocephalus. The calvarium is intact. CT MAXILLOFACIAL FINDINGS No facial bone fracture is identified. The globes are intact and the lenses are located. Orbital fat is clear. The mandibular  condyles are located. Paranasal sinuses and mastoid air cells are clear. CT CERVICAL SPINE FINDINGS No cervical spine fracture or malalignment is identified. Scattered facet degenerative change is noted. There  is some loss of disc space height at C5-6 and C6-7. Lung apices are clear. IMPRESSION: No acute abnormality head, face or cervical spine. Atrophy and chronic microvascular ischemic change. Mild cervical spondylosis. Electronically Signed   By: Drusilla Kanner M.D.   On: 11/15/2015 11:44   Dg Knee Complete 4 Views Left  Result Date: 11/15/2015 CLINICAL DATA:  Larey Seat this morning with right-sided pain. EXAM: LEFT KNEE - COMPLETE 4+ VIEW COMPARISON:  None. FINDINGS: No evidence of fracture, dislocation, or large joint effusion. Small amount of fluid in the suprapatellar recess. No evidence of arthropathy or other focal bone abnormality. Soft tissues are unremarkable. IMPRESSION: Negative. Electronically Signed   By: Paulina Fusi M.D.   On: 11/15/2015 13:18   Dg Knee Complete 4 Views Right  Result Date: 11/15/2015 CLINICAL DATA:  Pain following fall EXAM: RIGHT KNEE - COMPLETE 4+ VIEW COMPARISON:  None. FINDINGS: Frontal, lateral, and bilateral oblique views were obtained. No fracture is evident. There is apparent lateral patellar subluxation without frank dislocation. There is a rather minimal joint effusion. There is mild joint space narrowing medially. There is mild spurring in all compartments. There are foci of chondrocalcinosis. IMPRESSION: Lateral patellar subluxation with minimal joint effusion. No fracture or frank dislocation. Relatively mild osteoarthritic change medially. Chondrocalcinosis is present, a finding that may be seen with osteoarthritis or calcium pyrophosphate deposition disease. Electronically Signed   By: Bretta Bang III M.D.   On: 11/15/2015 13:15   Dg Hip Operative Unilat W Or W/o Pelvis Right  Result Date: 11/16/2015 CLINICAL DATA:  Hip fracture EXAM: OPERATIVE RIGHT HIP  (WITH PELVIS IF PERFORMED) 4 VIEWS TECHNIQUE: Fluoroscopic spot image(s) were submitted for interpretation post-operatively. COMPARISON:  None. FINDINGS: Patient is status post open reduction internal fixation of a 2 part intertrochanteric fracture, with a compression screw and intra medullary rod. Satisfactory position and alignment. IMPRESSION: As above. Electronically Signed   By: Elsie Stain M.D.   On: 11/16/2015 07:38   Dg Hip Unilat W Or Wo Pelvis 2-3 Views Right  Result Date: 11/15/2015 CLINICAL DATA:  Larey Seat this morning.  Right hip pain. EXAM: DG HIP (WITH OR WITHOUT PELVIS) 2-3V RIGHT COMPARISON:  None. FINDINGS: Nondisplaced intertrochanteric fracture of the right femur. No other pelvic fracture. IMPRESSION: Nondisplaced intertrochanteric fracture of the right femur. Electronically Signed   By: Paulina Fusi M.D.   On: 11/15/2015 13:15   Ct Maxillofacial Wo Cm  Result Date: 11/15/2015 CLINICAL DATA:  Status post fall this morning with a blow to the back of the head. Bruising about the nose and left eye. Initial encounter. EXAM: CT HEAD WITHOUT CONTRAST CT MAXILLOFACIAL WITHOUT CONTRAST CT CERVICAL SPINE WITHOUT CONTRAST TECHNIQUE: Multidetector CT imaging of the head, cervical spine, and maxillofacial structures were performed using the standard protocol without intravenous contrast. Multiplanar CT image reconstructions of the cervical spine and maxillofacial structures were also generated. COMPARISON:  None.  Head and cervical spine CT scans 09/01/2013. FINDINGS: CT HEAD FINDINGS Atrophy and chronic microvascular ischemic change are identified. No evidence of acute intracranial abnormality including hemorrhage, infarct, mass lesion, mass effect, midline shift or abnormal extra-axial fluid collection. No hydrocephalus or pneumocephalus. The calvarium is intact. CT MAXILLOFACIAL FINDINGS No facial bone fracture is identified. The globes are intact and the lenses are located. Orbital fat is clear. The  mandibular condyles are located. Paranasal sinuses and mastoid air cells are clear. CT CERVICAL SPINE FINDINGS No cervical spine fracture or malalignment is identified. Scattered facet degenerative change is noted. There  is some loss of disc space height at C5-6 and C6-7. Lung apices are clear. IMPRESSION: No acute abnormality head, face or cervical spine. Atrophy and chronic microvascular ischemic change. Mild cervical spondylosis. Electronically Signed   By: Drusilla Kanner M.D.   On: 11/15/2015 11:44     ASSESSMENT AND PLAN:    82 year old female with a history of mild dementia who presented status post mechanical fall and unfortunate suffered a right hip fracture.  1. Nondisplaced intertrochanteric fracture, right hip: Patient is postop day #2 and doing well. PT is recommending skilled nursing facility DVT prophylaxis With Lovenox for 14 days. Hemoglobin stable and no need for blood transfusion.   2. Community acquired pneumonia: Continue Levaquin.   3. Mild dementia: Stable. 4. Depression: Continue Paxil.  Management plans discussed with the patient and she is in agreement.  CODE STATUS: full  TOTAL TIME TAKING CARE OF THIS PATIENT: 25 minutes.     POSSIBLE D/C sunday, DEPENDING ON CLINICAL CONDITION.   Maeve Debord M.D on 11/17/2015 at 10:27 AM  Between 7am to 6pm - Pager - [phone] After 6pm go to [email or web address] - password [email or web address] [facility]  [facility]  Office  [phone]  CC: Primary care physician; Margaretann Loveless, PA-C  Note: This dictation was prepared with Dragon dictation along with smaller phrase technology. Any transcriptional errors that result from this process are unintentional.

## 2015-11-17 NOTE — Progress Notes (Signed)
Physical Therapy Treatment Patient Details Name: Virginia Osborne MRN: [id number] DOB: 11/12/1926 Today's Date: 11/17/2015    History of Present Illness 82 y/o female here after fall with R hip fx needing ORIF.  Pt with some baseline dementia and depression.     PT Comments    Pt continues to be pleasant but have some confusion and generally needs a lot of cuing and encouragement to fully participate.  She has pain with most R LE acts and mobility and continues to struggle with WBing on the R in standing/ambulation situations.  She is unable to show much strength at all during SLRs, but is showing some increased quad control with SAQ and QS.    Follow Up Recommendations  SNF     Equipment Recommendations       Recommendations for Other Services       Precautions / Restrictions Precautions Precautions: Fall Restrictions RLE Weight Bearing: Weight bearing as tolerated    Mobility  Bed Mobility Overal bed mobility: Needs Assistance Bed Mobility: Supine to Sit;Sit to Supine     Supine to sit: Mod assist Sit to supine: Mod assist;Max assist   General bed mobility comments: Pt c/o increased pain with transition to sitting at EOB and is able to help less this afternoon.  Pt does struggle to shift hips and move R LE.   Transfers Overall transfer level: Needs assistance Equipment used: Rolling walker (2 wheeled) Transfers: Sit to/from Stand Sit to Stand: Mod assist;Min assist         General transfer comment: Pt continues to need reminders for set up and hand placement as well as some direct assist but she shifted weight forward better this afternoon and was able to rise with less effort.   Ambulation/Gait Ambulation/Gait assistance: Mod assist Ambulation Distance (Feet): 4 Feet Assistive device: Rolling walker (2 wheeled)       General Gait Details: Pt able to take a few forward and back steps as well as some side steps along EOB but she does not take weight through the  R as well this afternoon and amost right away beings to ask to sit back down needing a lot of encouragement and cuing to try and do even the little that she was able to do.   Stairs            Wheelchair Mobility    Modified Rankin (Stroke Patients Only)       Balance                                    Cognition Arousal/Alertness: Awake/alert Behavior During Therapy: WFL for tasks assessed/performed Overall Cognitive Status: Within Functional Limits for tasks assessed                      Exercises General Exercises - Lower Extremity Ankle Circles/Pumps: Right;10 reps;AROM Quad Sets: Strengthening;10 reps;Right Gluteal Sets: Strengthening;Right;10 reps Short Arc Quad: AAROM;Strengthening;10 reps Heel Slides: AAROM;10 reps;Right Hip ABduction/ADduction: Right;10 reps;AROM;AAROM Straight Leg Raises: AAROM;5 reps    General Comments        Pertinent Vitals/Pain Pain Score: 6     Home Living                      Prior Function            PT Goals (current goals can now be found in the care  plan section) Progress towards PT goals: Progressing toward goals    Frequency  BID    PT Plan Current plan remains appropriate    Co-evaluation             End of Session Equipment Utilized During Treatment: Gait belt Activity Tolerance: Patient limited by pain Patient left: with call bell/phone within reach;with bed alarm set     Time: 1459-1524 PT Time Calculation (min) (ACUTE ONLY): 25 min  Charges:  $Gait Training: 8-22 mins $Therapeutic Exercise: 8-22 mins                    G Codes:      Malachi Pro, DPT 11/17/2015, 5:11 PM

## 2015-11-17 NOTE — Progress Notes (Signed)
Physical Therapy Treatment Patient Details Name: Virginia Osborne MRN: [id number] DOB: 30-Aug-1926 Today's Date: 11/17/2015    History of Present Illness 82 y/o female here after fall with R hip fx needing ORIF.  Pt with some baseline dementia and depression.     PT Comments    Pt shows good effort and though she still has some mild confusion she was pleasant and made good improvements.  She was actually able to put a few steps together to walk today though this was an epic effort for her and she was very fatigued after the bout.  She showed some increased strength in LEs and ability to do some more for getting to EOB but ultimately still requires a lot of assist with mobility acts.  Pt hesitant to put weight on the R, but shows better control of walker and ability to shift weight.   Follow Up Recommendations  SNF     Equipment Recommendations       Recommendations for Other Services       Precautions / Restrictions Precautions Precautions: Fall Restrictions RLE Weight Bearing: Weight bearing as tolerated    Mobility  Bed Mobility Overal bed mobility: Needs Assistance Bed Mobility: Supine to Sit     Supine to sit: Mod assist     General bed mobility comments: Pt does better getting LEs to EOB today, but still needs considerable assist with this and with getting trunk upright into sitting  Transfers Overall transfer level: Needs assistance Equipment used: Rolling walker (2 wheeled) Transfers: Sit to/from Stand Sit to Stand: Mod assist         General transfer comment: Pt able to shift weight forward with more confidence but still needing some assist to keep from falling back to bed  Ambulation/Gait Ambulation/Gait assistance: Mod assist Ambulation Distance (Feet): 8 Feet Assistive device: Rolling walker (2 wheeled)       General Gait Details: Pt able to use walker to keep weight off R LE today, and though she is still very hesitant she was able to take larger and  more appropriate steps today with a more gait like consistency. She fatigued very quickly and needed a lot of encouragment (from PT and family) to keep going.   Stairs            Wheelchair Mobility    Modified Rankin (Stroke Patients Only)       Balance                                    Cognition Arousal/Alertness: Awake/alert Behavior During Therapy: WFL for tasks assessed/performed Overall Cognitive Status: Within Functional Limits for tasks assessed                      Exercises General Exercises - Lower Extremity Ankle Circles/Pumps: Right;10 reps;AROM Quad Sets: Strengthening;10 reps;Right Gluteal Sets: Strengthening;Right;10 reps Heel Slides: AAROM;10 reps;Right Hip ABduction/ADduction: Right;AAROM;5 reps    General Comments        Pertinent Vitals/Pain Pain Score: 5     Home Living                      Prior Function            PT Goals (current goals can now be found in the care plan section) Progress towards PT goals: Progressing toward goals    Frequency  BID  PT Plan Current plan remains appropriate    Co-evaluation             End of Session Equipment Utilized During Treatment: Gait belt Activity Tolerance: Patient limited by pain Patient left: with call bell/phone within reach;with chair alarm set     Time: 1040-1120 PT Time Calculation (min) (ACUTE ONLY): 40 min  Charges:  $Gait Training: 8-22 mins $Therapeutic Exercise: 8-22 mins $Therapeutic Activity: 8-22 mins                    G Codes:      Malachi Pro, DPT 11/17/2015, 1:36 PM

## 2015-11-17 NOTE — Progress Notes (Signed)
Patient is alert, but plesently confused. Dressing to right hip area are CD&I. Swelling to right arm has decreased slightly throughout the day. Up with assist x2 with walker, poor transfer with a lot of cueing. Oral pain meds x2, with good relief. No BM this shift. Desats at rest to 87-89%, On 2L. Bed alarm on for safety.

## 2015-11-17 NOTE — Progress Notes (Signed)
Subjective: 2 Days Post-Op Procedure(s) (LRB): INTRAMEDULLARY (IM) NAIL INTERTROCHANTRIC (Right) Patient reports pain as mild.   Patient is well, and has had no acute complaints or problems Care Management and PT to assist with discharge Negative for chest pain and shortness of breath Fever: no Gastrointestinal:Negative for nausea and vomiting  Objective: Vital signs in last 24 hours: Temp:  [98.3 F (36.8 C)-98.7 F (37.1 C)] 98.3 F (36.8 C) (08/05 0418) Pulse Rate:  [79-95] 95 (08/05 0418) Resp:  [16-19] 16 (08/05 0418) BP: (100-139)/(43-58) 139/58 (08/05 0418) SpO2:  [93 %-97 %] 93 % (08/05 0418) Weight:  [74.5 kg (164 lb 4.8 oz)] 74.5 kg (164 lb 4.8 oz) (08/05 0418)  Intake/Output from previous day:  Intake/Output Summary (Last 24 hours) at 11/17/15 0727 Last data filed at 11/17/15 0600  Gross per 24 hour  Intake             2280 ml  Output              675 ml  Net             1605 ml    Intake/Output this shift: No intake/output data recorded.  Labs:  Recent Labs  11/15/15 1001 11/16/15 0535  HGB 15.5 10.8*    Recent Labs  11/15/15 1001 11/16/15 0535  WBC 11.5* 6.3  RBC 4.73 3.17*  HCT 44.4 30.3*  PLT 216 163    Recent Labs  11/16/15 0535 11/17/15 0400  NA 137 138  K 3.8 4.0  CL 107 107  CO2 28 28  BUN 10 10  CREATININE 0.65 0.83  GLUCOSE 121* 126*  CALCIUM 7.7* 8.0*   No results for input(s): LABPT, INR in the last 72 hours.   EXAM General - Patient is Alert, Appropriate, Oriented and drowsy this AM however able to answer questions appropriately Extremity - ABD soft Sensation intact distally Intact pulses distally Dorsiflexion/Plantar flexion intact Incision: dressing C/D/I No cellulitis present Dressing/Incision - clean, dry, no drainage Motor Function - intact, moving foot and toes well on exam.   Negative Homan's to bilateral lower extremities.  Past Medical History:  Diagnosis Date  . Constipation   . Dementia   .  Depression   . GERD (gastroesophageal reflux disease)   . Thyroid disease     Assessment/Plan: 2 Days Post-Op Procedure(s) (LRB): INTRAMEDULLARY (IM) NAIL INTERTROCHANTRIC (Right) Active Problems:   Hip fracture (HCC)  Estimated body mass index is 27.34 kg/m as calculated from the following:   Height as of this encounter: 5\' 5"  (1.651 m).   Weight as of this encounter: 74.5 kg (164 lb 4.8 oz). Advance diet Up with therapy D/C IV fluids when tolerating PO intake.  Labs reviewed Levaquin for pneumonia Up with PT.  Currently recommending SNF for the patient. Pt will need to have BM prior to discharge. Upon discharge, Lovenox 40mg  daily for 14 days. Staples can be removed in 10-14 days.  Follow-up with [facility] in 4-6 weeks.  DVT Prophylaxis - Lovenox, Foot Pumps and TED hose Weight-Bearing as tolerated to right leg  J. Horris Latino, PA-C [facility] 11/17/2015, 7:27 AM

## 2015-11-18 LAB — BASIC METABOLIC PANEL
Anion gap: 7 (ref 5–15)
BUN: 14 mg/dL (ref 6–20)
CALCIUM: 8.3 mg/dL — AB (ref 8.9–10.3)
CO2: 26 mmol/L (ref 22–32)
CREATININE: 0.74 mg/dL (ref 0.44–1.00)
Chloride: 104 mmol/L (ref 101–111)
GFR calc non Af Amer: >60 mL/min (ref >60)
Glucose, Bld: 114 mg/dL — ABNORMAL HIGH (ref 65–99)
Potassium: 4.2 mmol/L (ref 3.5–5.1)
SODIUM: 137 mmol/L (ref 135–145)

## 2015-11-18 LAB — CBC
HCT: 27.8 % — ABNORMAL LOW (ref 35.0–47.0)
Hemoglobin: 9.8 g/dL — ABNORMAL LOW (ref 12.0–16.0)
MCH: 33.7 pg (ref 26.0–34.0)
MCHC: 35.3 g/dL (ref 32.0–36.0)
MCV: 95.4 fL (ref 80.0–100.0)
PLATELETS: 193 10*3/uL (ref 150–440)
RBC: 2.92 MIL/uL — AB (ref 3.80–5.20)
RDW: 13.7 % (ref 11.5–14.5)
WBC: 8.2 10*3/uL (ref 3.6–11.0)

## 2015-11-18 MED ORDER — LEVOFLOXACIN 500 MG PO TABS: 500.0000 mg | ORAL_TABLET | Freq: Every day | ORAL | Status: DC

## 2015-11-18 NOTE — Clinical Social Work Placement (Signed)
   CLINICAL SOCIAL WORK PLACEMENT  NOTE  Date:  11/18/2015  Patient Details  Name: Virginia Osborne MRN: [id number] Date of Birth: 1927-02-26  Clinical Social Work is seeking post-discharge placement for this patient at the Skilled  Nursing Facility level of care (*CSW will initial, date and re-position this form in  chart as items are completed):  Yes   Patient/family provided with [facility] Clinical Social Work Department's list of facilities offering this level of care within the geographic area requested by the patient (or if unable, by the patient's family).  Yes   Patient/family informed of their freedom to choose among providers that offer the needed level of care, that participate in Medicare, Medicaid or managed care program needed by the patient, have an available bed and are willing to accept the patient.  Yes   Patient/family informed of [facility] ownership interest [facility] and [facility], as well as of the fact that they are under no obligation to receive care at these facilities.  PASRR submitted to EDS on 11/16/15     PASRR number received on 11/16/15     Existing PASRR number confirmed on 11/18/15     FL2 transmitted to all facilities in geographic area requested by pt/family on 11/16/15     FL2 transmitted to all facilities within larger geographic area on 11/15/15     Patient informed that his/her managed care company has contracts with or will negotiate with certain facilities, including the following:        Yes   Patient/family informed of bed offers received.  Patient chooses bed at  [facility] )     Physician recommends and patient chooses bed at  [facility])    Patient to be transferred to  [facility] Room 325) on 11/18/15.  Patient to be transferred to facility by  [facility])     Patient family notified on 11/18/15 of transfer.  Name of family member notified:  Olegario MessierKathy (daughter)     PHYSICIAN       Additional Comment:     _______________________________________________ Judi CongKaren M Mellisa Arshad, LCSW 11/18/2015, 9:50 AM

## 2015-11-18 NOTE — Anesthesia Postprocedure Evaluation (Signed)
Anesthesia Post Note  Patient: Virginia Osborne  Procedure(s) Performed: Procedure(s) (LRB): INTRAMEDULLARY (IM) NAIL INTERTROCHANTRIC (Right)  Patient location during evaluation: PACU Anesthesia Type: General Level of consciousness: awake and alert Pain management: pain level controlled Vital Signs Assessment: post-procedure vital signs reviewed and stable Respiratory status: spontaneous breathing, nonlabored ventilation, respiratory function stable and patient connected to nasal cannula oxygen Cardiovascular status: blood pressure returned to baseline and stable Postop Assessment: no signs of nausea or vomiting Anesthetic complications: no    Last Vitals:  Vitals:   11/18/15 0745 11/18/15 1210  BP: (!) 136/55 (!) 145/59  Pulse: 84 96  Resp:  18  Temp: 36.8 C 37 C    Last Pain:  Vitals:   11/18/15 1210  TempSrc: Oral  PainSc:                  Yevette EdwardsJames G Dannetta Lekas

## 2015-11-18 NOTE — Clinical Social Work Note (Addendum)
CSW delivered d/c packet to unit. [facility] aware of d/c. CSW reached patient's daughter Olegario MessierKathy who will be coming to the hospital within the next hour to follow EMS to the facility. CSW advised patient's daughter that will follow for any needs that may arise. Bryn GullingKaren Martha [place] MSW, LCSW-A  [phone]

## 2015-11-18 NOTE — Progress Notes (Signed)
PHARMACIST - PHYSICIAN COMMUNICATION CONCERNING: Antibiotic IV to Oral Route Change Policy  RECOMMENDATION: This patient is receiving Levofloxacin by the intravenous route.  Based on criteria approved by the Pharmacy and Therapeutics Committee, the antibiotic(s) is/are being converted to the equivalent oral dose form(s).   DESCRIPTION: These criteria include:  Patient being treated for a respiratory tract infection, urinary tract infection, cellulitis or clostridium difficile associated diarrhea if on metronidazole  The patient is not neutropenic and does not exhibit a GI malabsorption state  The patient is eating (either orally or via tube) and/or has been taking other orally administered medications for a least 24 hours  The patient is improving clinically and has a Tmax < 100.5  Lina Hitch K, [facility] HealthRPH Clinical Pharmacist 11/18/2015,12:30 PM

## 2015-11-18 NOTE — Progress Notes (Signed)
Physical Therapy Treatment Patient Details Name: ETOSHA WETHERELL MRN: [id number] DOB: 11/21/1926 Today's Date: 11/18/2015    History of Present Illness 82 y/o female here after fall with R hip fx needing ORIF.  Pt with some baseline dementia and depression.     PT Comments    Pt pleasant t/o session, but not overly eager to push herself with PT secondary to fatigue and pain.  She continues to be very hesitant to take much weight through the R and needs a lot of cuing, encouragement and assist to increase ambulation distance.  She is highly reliant on walker/UEs and generally is fatigues out quickly so as to highly limit how fast/far/safely she is able to ambulate.   Follow Up Recommendations  SNF     Equipment Recommendations       Recommendations for Other Services       Precautions / Restrictions Precautions Precautions: Fall Restrictions Weight Bearing Restrictions: Yes RLE Weight Bearing: Weight bearing as tolerated    Mobility  Bed Mobility               General bed mobility comments: Pt in recliner on arrival  Transfers Overall transfer level: Needs assistance Equipment used: Rolling walker (2 wheeled) Transfers: Sit to/from Stand Sit to Stand: Mod assist         General transfer comment: Pt struggles to effectively use L LE and UEs to rise, needed increased assist tody  Ambulation/Gait Ambulation/Gait assistance: Mod assist Ambulation Distance (Feet): 14 Feet Assistive device: Rolling walker (2 wheeled)       General Gait Details: Pt continues to be very hesitant to bear weight on the R LE and needs excessive UE assist and cuing (and occasional increased assist from PT) but with heavy encouragement was able to increase walking distance.  She was asking to sit down after just 4 or 5 feet and continued to bargin to be able to sit t/o the effort. She occasionally was able to use UEs to [employer] LE, but ultimately became very fatigued and needed more and  more assist.    Information systems manager [place] (Stroke Patients Only)       Balance                                    Cognition Arousal/Alertness: Awake/alert Behavior During Therapy: WFL for tasks assessed/performed Overall Cognitive Status: Within Functional Limits for tasks assessed                      Exercises General Exercises - Lower Extremity Ankle Circles/Pumps: Right;10 reps;AROM Quad Sets: Strengthening;10 reps;Right Gluteal Sets: Strengthening;Right;10 reps Short Arc Quad: AAROM;Strengthening;10 reps Heel Slides: AAROM;10 reps;Right Hip ABduction/ADduction: Right;10 reps;AROM;AAROM    General Comments        Pertinent Vitals/Pain Pain Score: 4  (increases with all R LE activities and WBing)    Home Living                      Prior Function            PT Goals (current goals can now be found in the care plan section) Progress towards PT goals: Progressing toward goals    Frequency  BID    PT Plan Current plan remains appropriate    Co-evaluation  End of Session Equipment Utilized During Treatment: Gait belt Activity Tolerance: Patient limited by pain Patient left: with call bell/phone within reach;with bed alarm set     Time: [id number] PT Time Calculation (min) (ACUTE ONLY): 31 min  Charges:  $Gait Training: 8-22 mins $Therapeutic Exercise: 8-22 mins                    G Codes:      Malachi ProGalen R Hana Trippett, DPT 11/18/2015, 12:06 PM

## 2015-11-18 NOTE — Progress Notes (Signed)
Subjective: 3 Days Post-Op Procedure(s) (LRB): INTRAMEDULLARY (IM) NAIL INTERTROCHANTRIC (Right) Patient reports pain as mild.   Patient is well, and has had no acute complaints or problems PT currently recommending SNF for rehab. Negative for chest pain and shortness of breath Fever: no Gastrointestinal:Negative for nausea and vomiting  Objective: Vital signs in last 24 hours: Temp:  [98.2 F (36.8 C)-98.6 F (37 C)] 98.2 F (36.8 C) (08/06 0437) Pulse Rate:  [84-99] 84 (08/06 0437) Resp:  [16-20] 18 (08/06 0437) BP: (132-149)/(48-63) 140/50 (08/06 0437) SpO2:  [89 %-99 %] 99 % (08/06 0437)  Intake/Output from previous day:  Intake/Output Summary (Last 24 hours) at 11/18/15 0829 Last data filed at 11/17/15 1917  Gross per 24 hour  Intake              480 ml  Output                0 ml  Net              480 ml    Intake/Output this shift: No intake/output data recorded.  Labs:  Recent Labs  11/15/15 1001 11/16/15 0535 11/17/15 0400 11/18/15 0439  HGB 15.5 10.8* 10.5* 9.8*    Recent Labs  11/17/15 0400 11/18/15 0439  WBC 8.7 8.2  RBC 3.16* 2.92*  HCT 30.7* 27.8*  PLT 182 193    Recent Labs  11/17/15 0400 11/18/15 0439  NA 138 137  K 4.0 4.2  CL 107 104  CO2 28 26  BUN 10 14  CREATININE 0.83 0.74  GLUCOSE 126* 114*  CALCIUM 8.0* 8.3*   No results for input(s): LABPT, INR in the last 72 hours.   EXAM General - Patient is Alert, Oriented and pleasantly demented this AM Extremity - ABD soft Sensation intact distally Intact pulses distally Dorsiflexion/Plantar flexion intact Incision: dressing C/D/I No cellulitis present Dressing/Incision - clean, dry, no drainage Motor Function - intact, moving foot and toes well on exam.   Negative Homan's to bilateral lower extremities.  Mild pain with plantarflexion of the right ankle.  Past Medical History:  Diagnosis Date  . Constipation   . Dementia   . Depression   . GERD (gastroesophageal  reflux disease)   . Thyroid disease     Assessment/Plan: 3 Days Post-Op Procedure(s) (LRB): INTRAMEDULLARY (IM) NAIL INTERTROCHANTRIC (Right) Active Problems:   Hip fracture (HCC)  Estimated body mass index is 27.34 kg/m as calculated from the following:   Height as of this encounter: 5\' 5"  (1.651 m).   Weight as of this encounter: 74.5 kg (164 lb 4.8 oz). Advance diet Up with therapy D/C IV fluids when tolerating PO intake.  Labs reviewed, Hg stable at 9.8 this AM. Levaquin for pneumonia Up with PT.  Currently recommending SNF for the patient. Pt has had a BM Upon discharge, Lovenox 40mg  daily for 14 days. Staples can be removed in 10-14 days.  Follow-up with [facility] in 4-6 weeks. Pt is ready for discharge from a orthopaedic standpoint, will discharge when medically appropriate.  DVT Prophylaxis - Lovenox, Foot Pumps and TED hose Weight-Bearing as tolerated to right leg  J. Horris LatinoLance Goranson, PA-C [facility] 11/18/2015, 8:29 AM

## 2015-11-18 NOTE — Progress Notes (Signed)
[facility]. IV removed with cath intact. Report called. Daughter at bedside during transportation. VSS. Paperwork and belongings sent with patient.

## 2015-11-19 ENCOUNTER — Other Ambulatory Visit: Payer: Self-pay | Admitting: Internal Medicine

## 2015-11-20 DIAGNOSIS — E039 Hypothyroidism, unspecified: Secondary | ICD-10-CM

## 2015-11-20 DIAGNOSIS — S7291XA Unspecified fracture of right femur, initial encounter for closed fracture: Secondary | ICD-10-CM

## 2015-11-20 DIAGNOSIS — K219 Gastro-esophageal reflux disease without esophagitis: Secondary | ICD-10-CM | POA: Diagnosis not present

## 2015-11-20 DIAGNOSIS — F339 Major depressive disorder, recurrent, unspecified: Secondary | ICD-10-CM

## 2015-11-20 DIAGNOSIS — J181 Lobar pneumonia, unspecified organism: Secondary | ICD-10-CM

## 2015-11-28 LAB — CULTURE, BLOOD (ROUTINE X 2)
Culture: NO GROWTH
Culture: NO GROWTH

## 2015-11-29 DIAGNOSIS — R569 Unspecified convulsions: Secondary | ICD-10-CM | POA: Diagnosis not present

## 2015-12-10 ENCOUNTER — Encounter: Payer: Self-pay | Admitting: Internal Medicine

## 2015-12-10 ENCOUNTER — Non-Acute Institutional Stay (SKILLED_NURSING_FACILITY): Payer: Medicare Other | Admitting: Internal Medicine

## 2015-12-10 DIAGNOSIS — G4700 Insomnia, unspecified: Secondary | ICD-10-CM | POA: Diagnosis not present

## 2015-12-10 DIAGNOSIS — R030 Elevated blood-pressure reading, without diagnosis of hypertension: Secondary | ICD-10-CM

## 2015-12-10 DIAGNOSIS — F329 Major depressive disorder, single episode, unspecified: Secondary | ICD-10-CM | POA: Diagnosis not present

## 2015-12-10 DIAGNOSIS — IMO0001 Reserved for inherently not codable concepts without codable children: Secondary | ICD-10-CM

## 2015-12-10 DIAGNOSIS — R2681 Unsteadiness on feet: Secondary | ICD-10-CM

## 2015-12-10 DIAGNOSIS — K219 Gastro-esophageal reflux disease without esophagitis: Secondary | ICD-10-CM

## 2015-12-10 DIAGNOSIS — E038 Other specified hypothyroidism: Secondary | ICD-10-CM | POA: Diagnosis not present

## 2015-12-10 DIAGNOSIS — D62 Acute posthemorrhagic anemia: Secondary | ICD-10-CM

## 2015-12-10 DIAGNOSIS — K5900 Constipation, unspecified: Secondary | ICD-10-CM

## 2015-12-10 DIAGNOSIS — S72001S Fracture of unspecified part of neck of right femur, sequela: Secondary | ICD-10-CM

## 2015-12-10 DIAGNOSIS — R531 Weakness: Secondary | ICD-10-CM | POA: Diagnosis not present

## 2015-12-10 NOTE — Progress Notes (Signed)
Patient ID: Virginia Osborne, female   DOB: 1926/06/22, 82 y.o.   MRN: [id number]     LOCATION: Facility: [facility]    PCP: Margaretann Loveless, PA-C   Code Status: DNR  Goals of care: Advanced Directive information Advanced Directives 12/10/2015  Does patient have an advance directive? Yes  Type of Advance Directive Out of facility DNR (pink MOST or yellow form)  Does patient want to make changes to advanced directive? No - Patient declined  Copy of advanced directive(s) in chart? Yes  Would patient like information on creating an advanced directive? -       Extended Emergency Contact Information Primary Emergency Contact: Everrett [place] Home Phone: [phone] Relation: Daughter   Allergies  Allergen Reactions  . Erythromycin     Patient unsure of reaction    Chief Complaint  Patient presents with  . New Admit To SNF    New Admission     HPI:  Patient is a 82 y.o. female seen today for short term rehabilitation post hospital admission from 11/15/15-11/18/15 with right hip fracture status post fall with altered mental state and left lung pneumonia. She underwent surgical repair of her hip and was placed on antibiotic for pneumonia. She was then sent to SNF for rehabilitation and was transferred from there to continue rehabilitation. She has PMH of dementia, depression.  She is seen in her room today.   Review of Systems:  Constitutional: Negative for fever, chills, diaphoresis.  HENT: Negative for headache, congestion, nasal discharge. Eyes: Negative for blurred vision, double vision and discharge.  Respiratory: Negative for cough, shortness of breath and wheezing.   Cardiovascular: Negative for chest pain, palpitations, leg swelling.  Gastrointestinal: Negative for heartburn, nausea, vomiting, abdominal pain. Had bowel movement yesterday Genitourinary: Negative for dysuria and flank pain.  Musculoskeletal: Negative  for back pain, fall. denies pain at present.  Skin: Negative for itching, rash.  Neurological: Negative for dizziness. Psychiatric/Behavioral: Negative for depression   Past Medical History:  Diagnosis Date  . Constipation   . Dementia   . Depression   . GERD (gastroesophageal reflux disease)   . Thyroid disease    Past Surgical History:  Procedure Laterality Date  . ABDOMINAL HYSTERECTOMY  1980   total  . CHOLECYSTECTOMY  1990  . INTRAMEDULLARY (IM) NAIL INTERTROCHANTERIC Right 11/15/2015   Procedure: INTRAMEDULLARY (IM) NAIL INTERTROCHANTRIC;  Surgeon: Christena Flake, MD;  Location: [facility] ORS;  Service: Orthopedics;  Laterality: Right;   Social History:   reports that she has never smoked. She has never used smokeless tobacco. She reports that she does not drink alcohol or use drugs.  Family History  Problem Relation Age of Onset  . Diabetes Sister     Medications:   Medication List       Accurate as of 12/10/15  3:12 PM. Always use your most recent med list.          levothyroxine 112 MCG tablet Commonly known as:  SYNTHROID, LEVOTHROID Take 112 mcg by mouth daily before breakfast.   magnesium hydroxide 400 MG/5ML suspension Commonly known as:  MILK OF MAGNESIA Take 30 mLs by mouth daily as needed for mild constipation.   omeprazole 20 MG capsule Commonly known as:  PRILOSEC Take 20 mg by mouth daily.   oxyCODONE 5 MG immediate release tablet Commonly known as:  Oxy IR/ROXICODONE Take 1-2 tablets (5-10 mg total) by mouth every 3 (three) hours as needed for breakthrough  pain.   OXYGEN Inhale into the lungs. 2L per nasal cannula PRN for dyspnea/SOB. Notify physician if O2 sat cannot be elevated to 90 or greater.   PARoxetine 20 MG tablet Commonly known as:  PAXIL Take 20 mg by mouth daily.   POLYETHYLENE GLYCOL 3350 PO Take 17 g by mouth daily.   SENNA S PO Take 2 tablets by mouth daily as needed.   traZODone 150 MG tablet Commonly known as:   DESYREL Take 150 mg by mouth at bedtime.       Immunizations: Immunization History  Administered Date(s) Administered  . Influenza, High Dose Seasonal PF 02/20/2015  . Pneumococcal Conjugate-13 02/15/2014  . Pneumococcal Polysaccharide-23 02/20/2015     Physical Exam:  Vitals:   12/10/15 1358  BP: (!) 145/70  Pulse: 83  Temp: (!) 96.5 F (35.8 C)  TempSrc: Oral  SpO2: 95%   General- elderly female, frail, in no acute distress Head- normocephalic, atraumatic Nose- no maxillary or frontal sinus tenderness, no nasal discharge Throat- moist mucus membrane  Eyes- PERRLA, EOMI, no pallor, no icterus Neck- no cervical lymphadenopathy Cardiovascular- normal s1,s2, no murmur Respiratory- bilateral clear to auscultation, no wheeze, no rhonchi, no crackles, no use of accessory muscles Abdomen- bowel sounds present, soft, non tender Musculoskeletal- able to move all 4 extremities, limited right hip ROM, no leg edema Neurological- alert and oriented to person and time Skin- warm and dry, right hip surgical incision with dressing clean and dry Psychiatry- normal mood and affect    Labs reviewed: Basic Metabolic Panel:  Recent Labs  09/81/19 0535 11/17/15 0400 11/18/15 0439  NA 137 138 137  K 3.8 4.0 4.2  CL 107 107 104  CO2 28 28 26   GLUCOSE 121* 126* 114*  BUN 10 10 14   CREATININE 0.65 0.83 0.74  CALCIUM 7.7* 8.0* 8.3*   CBC:  Recent Labs  11/15/15 1001 11/16/15 0535 11/17/15 0400 11/18/15 0439  WBC 11.5* 6.3 8.7 8.2  NEUTROABS 10.5*  --   --   --   HGB 15.5 10.8* 10.5* 9.8*  HCT 44.4 30.3* 30.7* 27.8*  MCV 93.8 95.3 97.1 95.4  PLT 216 163 182 193   CBG:  Recent Labs  11/15/15 1510  GLUCAP 122*    Radiological Exams: Dg Chest 1 View  Result Date: 11/16/2015 CLINICAL DATA:  Pneumonia EXAM: CHEST 1 VIEW COMPARISON:  11/15/2015 FINDINGS: Right basilar airspace opacity again noted, similar to prior study. Previously seen opacity in the left base has  improved, nearly completely resolved. No visible effusions. Heart is borderline in size. Atherosclerotic calcifications in the aortic arch. IMPRESSION: Stable right basilar atelectasis or infiltrate. Near complete resolution of the left lower lobe opacity. Aortic atherosclerosis. Electronically Signed   By: Charlett Nose M.D.   On: 11/16/2015 09:03   Dg Chest 1 View  Result Date: 11/15/2015 CLINICAL DATA:  Larey Seat this morning, fell yesterday as well, history dementia EXAM: CHEST 1 VIEW COMPARISON:  09/03/2013 FINDINGS: Enlargement of cardiac silhouette with pulmonary vascular congestion. Atherosclerotic calcification aorta. RIGHT basilar atelectasis. Increased LEFT lower lobe opacity question atelectasis versus consolidation. Slight accentuation of interstitial markings versus previous exam which could reflect supine technique or minimal congestion. No pleural effusion or pneumothorax. Bones demineralized. Surgical clips RIGHT upper quadrant question cholecystectomy. IMPRESSION: Enlargement of cardiac silhouette with pulmonary vascular congestion with question minimal edema. RIGHT basilar atelectasis with new consolidation versus atelectasis in LEFT lower lobe. Electronically Signed   By: Ulyses Southward M.D.   On: 11/15/2015  13:15   Dg Elbow Complete Left  Result Date: 11/15/2015 CLINICAL DATA:  Patient fell yesterday. EXAM: LEFT ELBOW - COMPLETE 3+ VIEW COMPARISON:  None. FINDINGS: No evidence of fracture of the ulna or humerus. The radial head is normal. No joint effusion. IMPRESSION: No fracture or dislocation. Electronically Signed   By: Genevive Bi M.D.   On: 11/15/2015 13:15   Ct Head Wo Contrast  Result Date: 11/15/2015 CLINICAL DATA:  Status post fall this morning with a blow to the back of the head. Bruising about the nose and left eye. Initial encounter. EXAM: CT HEAD WITHOUT CONTRAST CT MAXILLOFACIAL WITHOUT CONTRAST CT CERVICAL SPINE WITHOUT CONTRAST TECHNIQUE: Multidetector CT imaging of the  head, cervical spine, and maxillofacial structures were performed using the standard protocol without intravenous contrast. Multiplanar CT image reconstructions of the cervical spine and maxillofacial structures were also generated. COMPARISON:  None.  Head and cervical spine CT scans 09/01/2013. FINDINGS: CT HEAD FINDINGS Atrophy and chronic microvascular ischemic change are identified. No evidence of acute intracranial abnormality including hemorrhage, infarct, mass lesion, mass effect, midline shift or abnormal extra-axial fluid collection. No hydrocephalus or pneumocephalus. The calvarium is intact. CT MAXILLOFACIAL FINDINGS No facial bone fracture is identified. The globes are intact and the lenses are located. Orbital fat is clear. The mandibular condyles are located. Paranasal sinuses and mastoid air cells are clear. CT CERVICAL SPINE FINDINGS No cervical spine fracture or malalignment is identified. Scattered facet degenerative change is noted. There is some loss of disc space height at C5-6 and C6-7. Lung apices are clear. IMPRESSION: No acute abnormality head, face or cervical spine. Atrophy and chronic microvascular ischemic change. Mild cervical spondylosis. Electronically Signed   By: Drusilla Kanner M.D.   On: 11/15/2015 11:44   Ct Cervical Spine Wo Contrast  Result Date: 11/15/2015 CLINICAL DATA:  Status post fall this morning with a blow to the back of the head. Bruising about the nose and left eye. Initial encounter. EXAM: CT HEAD WITHOUT CONTRAST CT MAXILLOFACIAL WITHOUT CONTRAST CT CERVICAL SPINE WITHOUT CONTRAST TECHNIQUE: Multidetector CT imaging of the head, cervical spine, and maxillofacial structures were performed using the standard protocol without intravenous contrast. Multiplanar CT image reconstructions of the cervical spine and maxillofacial structures were also generated. COMPARISON:  None.  Head and cervical spine CT scans 09/01/2013. FINDINGS: CT HEAD FINDINGS Atrophy and chronic  microvascular ischemic change are identified. No evidence of acute intracranial abnormality including hemorrhage, infarct, mass lesion, mass effect, midline shift or abnormal extra-axial fluid collection. No hydrocephalus or pneumocephalus. The calvarium is intact. CT MAXILLOFACIAL FINDINGS No facial bone fracture is identified. The globes are intact and the lenses are located. Orbital fat is clear. The mandibular condyles are located. Paranasal sinuses and mastoid air cells are clear. CT CERVICAL SPINE FINDINGS No cervical spine fracture or malalignment is identified. Scattered facet degenerative change is noted. There is some loss of disc space height at C5-6 and C6-7. Lung apices are clear. IMPRESSION: No acute abnormality head, face or cervical spine. Atrophy and chronic microvascular ischemic change. Mild cervical spondylosis. Electronically Signed   By: Drusilla Kanner M.D.   On: 11/15/2015 11:44   Dg Knee Complete 4 Views Left  Result Date: 11/15/2015 CLINICAL DATA:  Larey Seat this morning with right-sided pain. EXAM: LEFT KNEE - COMPLETE 4+ VIEW COMPARISON:  None. FINDINGS: No evidence of fracture, dislocation, or large joint effusion. Small amount of fluid in the suprapatellar recess. No evidence of arthropathy or other focal bone abnormality. Soft  tissues are unremarkable. IMPRESSION: Negative. Electronically Signed   By: Paulina Fusi M.D.   On: 11/15/2015 13:18   Dg Knee Complete 4 Views Right  Result Date: 11/15/2015 CLINICAL DATA:  Pain following fall EXAM: RIGHT KNEE - COMPLETE 4+ VIEW COMPARISON:  None. FINDINGS: Frontal, lateral, and bilateral oblique views were obtained. No fracture is evident. There is apparent lateral patellar subluxation without frank dislocation. There is a rather minimal joint effusion. There is mild joint space narrowing medially. There is mild spurring in all compartments. There are foci of chondrocalcinosis. IMPRESSION: Lateral patellar subluxation with minimal joint  effusion. No fracture or frank dislocation. Relatively mild osteoarthritic change medially. Chondrocalcinosis is present, a finding that may be seen with osteoarthritis or calcium pyrophosphate deposition disease. Electronically Signed   By: Bretta Bang III M.D.   On: 11/15/2015 13:15   Dg Hip Operative Unilat W Or W/o Pelvis Right  Result Date: 11/16/2015 CLINICAL DATA:  Hip fracture EXAM: OPERATIVE RIGHT HIP (WITH PELVIS IF PERFORMED) 4 VIEWS TECHNIQUE: Fluoroscopic spot image(s) were submitted for interpretation post-operatively. COMPARISON:  None. FINDINGS: Patient is status post open reduction internal fixation of a 2 part intertrochanteric fracture, with a compression screw and intra medullary rod. Satisfactory position and alignment. IMPRESSION: As above. Electronically Signed   By: Elsie Stain M.D.   On: 11/16/2015 07:38   Dg Hip Unilat W Or Wo Pelvis 2-3 Views Right  Result Date: 11/15/2015 CLINICAL DATA:  Larey Seat this morning.  Right hip pain. EXAM: DG HIP (WITH OR WITHOUT PELVIS) 2-3V RIGHT COMPARISON:  None. FINDINGS: Nondisplaced intertrochanteric fracture of the right femur. No other pelvic fracture. IMPRESSION: Nondisplaced intertrochanteric fracture of the right femur. Electronically Signed   By: Paulina Fusi M.D.   On: 11/15/2015 13:15   Ct Maxillofacial Wo Cm  Result Date: 11/15/2015 CLINICAL DATA:  Status post fall this morning with a blow to the back of the head. Bruising about the nose and left eye. Initial encounter. EXAM: CT HEAD WITHOUT CONTRAST CT MAXILLOFACIAL WITHOUT CONTRAST CT CERVICAL SPINE WITHOUT CONTRAST TECHNIQUE: Multidetector CT imaging of the head, cervical spine, and maxillofacial structures were performed using the standard protocol without intravenous contrast. Multiplanar CT image reconstructions of the cervical spine and maxillofacial structures were also generated. COMPARISON:  None.  Head and cervical spine CT scans 09/01/2013. FINDINGS: CT HEAD FINDINGS  Atrophy and chronic microvascular ischemic change are identified. No evidence of acute intracranial abnormality including hemorrhage, infarct, mass lesion, mass effect, midline shift or abnormal extra-axial fluid collection. No hydrocephalus or pneumocephalus. The calvarium is intact. CT MAXILLOFACIAL FINDINGS No facial bone fracture is identified. The globes are intact and the lenses are located. Orbital fat is clear. The mandibular condyles are located. Paranasal sinuses and mastoid air cells are clear. CT CERVICAL SPINE FINDINGS No cervical spine fracture or malalignment is identified. Scattered facet degenerative change is noted. There is some loss of disc space height at C5-6 and C6-7. Lung apices are clear. IMPRESSION: No acute abnormality head, face or cervical spine. Atrophy and chronic microvascular ischemic change. Mild cervical spondylosis. Electronically Signed   By: Drusilla Kanner M.D.   On: 11/15/2015 11:44    Assessment/Plan  Unsteady gait With right hip fracture s/p surgical repair. Will have patient work with PT/OT as tolerated to regain strength and restore function.  Fall precautions are in place.  Generalized weakness With deconditioning. Here for rehabilitation  Right hip fracture S/p surgical repair and has orthopedic follow up. Continue roxicodone 5 mg 1-2  tab q3h prn pain. Her staples have been removed and she is off DVT prophylaxis/ lovenox. Will have her work with physical therapy and occupational therapy team to help with gait training and muscle strengthening exercises.fall precautions. Skin care. Encourage to be out of bed.   Blood loss anemia Monitor cbc, post op  Constipation Continue miralax daily with senna s 2 tab daily as needed with milk of magnesia daily as needed  gerd Stable continue omeprazole 20 mg daily  Depression Stable, continue paxil  insomnia Continue trazodone  Hypothyroidism Continue levothyroxine 112 mcg daily. Check tsh No results  found for: TSH  Elevated BP Check bp bid x 1 week to assess further and consider antihypertensive if needed. Check cmp   Goals of care: short term rehabilitation   Labs/tests ordered: cbc, cmp, tsh  Family/ staff Communication: reviewed care plan with patient and nursing supervisor    Oneal GroutMAHIMA Jhonnie Aliano, MD Internal Medicine [facility] [address] [place], [place] [address] Cell Phone (Monday-Friday 8 am - 5 pm): [phone] On Call: [phone] and follow prompts after 5 pm and on weekends Office Phone: [phone] Office Fax: [phone]

## 2015-12-11 LAB — CBC AND DIFFERENTIAL
HCT: 36 % (ref 36–46)
HEMOGLOBIN: 11.4 g/dL — AB (ref 12.0–16.0)
Platelets: 407 10*3/uL — AB (ref 150–399)
WBC: 6.8 10*3/mL

## 2015-12-19 ENCOUNTER — Non-Acute Institutional Stay (SKILLED_NURSING_FACILITY): Payer: Medicare Other | Admitting: Family

## 2015-12-19 DIAGNOSIS — R2681 Unsteadiness on feet: Secondary | ICD-10-CM

## 2015-12-19 DIAGNOSIS — G4700 Insomnia, unspecified: Secondary | ICD-10-CM | POA: Diagnosis not present

## 2015-12-19 DIAGNOSIS — E039 Hypothyroidism, unspecified: Secondary | ICD-10-CM

## 2015-12-19 MED ORDER — MELATONIN 3 MG PO TABS: 3.0000 mg | ORAL_TABLET | Freq: Every day | ORAL | 0 refills | Status: AC

## 2015-12-19 NOTE — Progress Notes (Addendum)
Location:   [facility]  Nursing Home Room Number: [id number]  Place of Service:  SNF [phone]) Provider:  Coraline Talwar FNP-C   Margaretann Loveless, PA-C  Patient Care Team: Margaretann Loveless, PA-C as PCP - General (Family Medicine)  Extended Emergency Contact Information Primary Emergency Contact: Everrett [place] Home Phone: [phone] Relation: Daughter  Code Status:  DNR  Goals of care: Advanced Directive information Advanced Directives 12/10/2015  Does patient have an advance directive? Yes  Type of Advance Directive Out of facility DNR (pink MOST or yellow form)  Does patient want to make changes to advanced directive? No - Patient declined  Copy of advanced directive(s) in chart? Yes  Would patient like information on creating an advanced directive? -     Chief Complaint  Patient presents with  . Acute Visit    insomnia     HPI:  Pt is a 82 y.o. female seen today at [facility] for an acute visit for evaluation of inability to sleep. She seen in her room today per patient's Nurse request. She complains of not being able to sleep at bedtime. She states right hip pain under control. She was not on any sleep Aid at home.  TSH level results 14.016   Past Medical History:  Diagnosis Date  . Constipation   . Dementia   . Depression   . GERD (gastroesophageal reflux disease)   . Thyroid disease    Past Surgical History:  Procedure Laterality Date  . ABDOMINAL HYSTERECTOMY  1980   total  . CHOLECYSTECTOMY  1990  . INTRAMEDULLARY (IM) NAIL INTERTROCHANTERIC Right 11/15/2015   Procedure: INTRAMEDULLARY (IM) NAIL INTERTROCHANTRIC;  Surgeon: Christena Flake, MD;  Location: [facility] ORS;  Service: Orthopedics;  Laterality: Right;    Allergies  Allergen Reactions  . Erythromycin     Patient unsure of reaction      Medication List       Accurate as of 12/19/15  3:08 PM. Always use your most recent med list.            levothyroxine 112 MCG tablet Commonly known as:  SYNTHROID, LEVOTHROID Take 112 mcg by mouth daily before breakfast.   magnesium hydroxide 400 MG/5ML suspension Commonly known as:  MILK OF MAGNESIA Take 30 mLs by mouth daily as needed for mild constipation.   Melatonin 3 MG Tabs Take 1 tablet (3 mg total) by mouth at bedtime.   omeprazole 20 MG capsule Commonly known as:  PRILOSEC Take 20 mg by mouth daily.   oxyCODONE 5 MG immediate release tablet Commonly known as:  Oxy IR/ROXICODONE Take 1-2 tablets (5-10 mg total) by mouth every 3 (three) hours as needed for breakthrough pain.   OXYGEN Inhale into the lungs. 2L per nasal cannula PRN for dyspnea/SOB. Notify physician if O2 sat cannot be elevated to 90 or greater.   PARoxetine 20 MG tablet Commonly known as:  PAXIL Take 20 mg by mouth daily.   POLYETHYLENE GLYCOL 3350 PO Take 17 g by mouth daily.   SENNA S PO Take 2 tablets by mouth daily as needed.   traZODone 150 MG tablet Commonly known as:  DESYREL Take 150 mg by mouth at bedtime.       Review of Systems  Constitutional: Negative for activity change, appetite change, chills and fever.  HENT: Negative for congestion, rhinorrhea, sinus pressure, sneezing and sore throat.   Respiratory: Negative for cough, chest tightness, shortness  of breath and wheezing.   Cardiovascular: Negative for chest pain, palpitations and leg swelling.  Gastrointestinal: Negative for abdominal distention, constipation, diarrhea and nausea.  Endocrine: Negative.   Genitourinary: Negative for dysuria, frequency and urgency.  Musculoskeletal: Positive for gait problem.       Right hip undercontrol.   Skin: Negative for color change, pallor and rash.  Neurological: Negative for dizziness, seizures, light-headedness and headaches.  Psychiatric/Behavioral: Negative for agitation, confusion, hallucinations and sleep disturbance. The patient is not nervous/anxious.     Immunization History   Administered Date(s) Administered  . Influenza, High Dose Seasonal PF 02/20/2015  . Pneumococcal Conjugate-13 02/15/2014  . Pneumococcal Polysaccharide-23 02/20/2015   Pertinent  Health Maintenance Due  Topic Date Due  . DEXA SCAN  07/27/1993  . INFLUENZA VACCINE  11/13/2015  . PNA vac Low Risk Adult  Completed   Fall Risk  02/20/2015  Falls in the past year? No      Vitals:   12/19/15 1020  BP: (!) 147/81  Pulse: 79  Resp: 18  Temp: 98.6 F (37 C)  SpO2: 93%  Weight: 157 lb 4.8 oz (71.4 kg)  Height: 5\' 5"  (1.651 m)   Body mass index is 26.18 kg/m. Physical Exam  Constitutional: She is oriented to person, place, and time. She appears well-developed and well-nourished. No distress.  HENT:  Head: Normocephalic.  Mouth/Throat: Oropharynx is clear and moist. No oropharyngeal exudate.  Eyes: Conjunctivae and EOM are normal. Pupils are equal, round, and reactive to light. Right eye exhibits no discharge. Left eye exhibits no discharge. No scleral icterus.  Neck: Normal range of motion. No JVD present. No thyromegaly present.  Cardiovascular: Normal rate, regular rhythm, normal heart sounds and intact distal pulses.  Exam reveals no gallop and no friction rub.   No murmur heard. Pulmonary/Chest: Effort normal and breath sounds normal.  Abdominal: Soft. Bowel sounds are normal. She exhibits no distension. There is no tenderness. There is no rebound and no guarding.  Genitourinary:  Genitourinary Comments: Continent   Musculoskeletal: She exhibits no edema, tenderness or deformity.  Moves X 4 extremities right hip limited ROM pain. Use FWW  Lymphadenopathy:    She has no cervical adenopathy.  Neurological: She is oriented to person, place, and time.  Skin: Skin is warm and dry. No rash noted. No erythema. No pallor.  Right hip surgical incision healed.   Psychiatric: She has a normal mood and affect.    Labs reviewed:  Recent Labs  11/16/15 0535 11/17/15 0400  11/18/15 0439  NA 137 138 137  K 3.8 4.0 4.2  CL 107 107 104  CO2 28 28 26   GLUCOSE 121* 126* 114*  BUN 10 10 14   CREATININE 0.65 0.83 0.74  CALCIUM 7.7* 8.0* 8.3*    Recent Labs  11/15/15 1001 11/16/15 0535 11/17/15 0400 11/18/15 0439  WBC 11.5* 6.3 8.7 8.2  NEUTROABS 10.5*  --   --   --   HGB 15.5 10.8* 10.5* 9.8*  HCT 44.4 30.3* 30.7* 27.8*  MCV 93.8 95.3 97.1 95.4  PLT 216 163 182 193   Assessment/Plan  Insomnia Start on Melatonin 3 mg Tablet at bedtime. Continue to monitor.    Unsteady Gait Uses FWW. Continue with PT/OT for ROM, exercise, Gait stability and muscle strengthening. Fall and safety precautions.   Hypothyroidism  TSH level 14.016 will increase Levothyroxine to 125 mcg Tablet daily on empty stomach. Recheck TSH level in 8 weeks.     Family/ staff Communication:  Reviewed plan of care with patient and facility Nurse supervisor.   Labs/tests ordered:  None

## 2015-12-21 LAB — TSH: TSH: 14.02 u[IU]/mL — AB (ref 0.41–5.90)

## 2015-12-21 MED ORDER — LEVOTHYROXINE SODIUM 125 MCG PO TABS
125.0000 ug | ORAL_TABLET | Freq: Every day | ORAL | Status: DC
Start: 2015-12-21 — End: 2016-07-21

## 2015-12-21 NOTE — Addendum Note (Signed)
Addended byRicharda Blade: NGETICH, DINAH C on: 12/21/2015 02:47 PM   Modules accepted: Orders

## 2016-01-16 ENCOUNTER — Non-Acute Institutional Stay (SKILLED_NURSING_FACILITY): Payer: Medicare Other | Admitting: Family

## 2016-01-16 DIAGNOSIS — F329 Major depressive disorder, single episode, unspecified: Secondary | ICD-10-CM

## 2016-01-16 DIAGNOSIS — K219 Gastro-esophageal reflux disease without esophagitis: Secondary | ICD-10-CM | POA: Diagnosis not present

## 2016-01-16 DIAGNOSIS — K5901 Slow transit constipation: Secondary | ICD-10-CM | POA: Diagnosis not present

## 2016-01-16 DIAGNOSIS — F32A Depression, unspecified: Secondary | ICD-10-CM

## 2016-01-16 DIAGNOSIS — E039 Hypothyroidism, unspecified: Secondary | ICD-10-CM

## 2016-01-16 DIAGNOSIS — K5900 Constipation, unspecified: Secondary | ICD-10-CM | POA: Insufficient documentation

## 2016-01-16 DIAGNOSIS — R2681 Unsteadiness on feet: Secondary | ICD-10-CM

## 2016-01-16 NOTE — Progress Notes (Signed)
Location:  [facility] Room Number: 208 P Place of Service:  SNF [phone]) Provider:Shamari Trostel FNP-C   Margaretann Loveless, PA-C  Patient Care Team: Margaretann Loveless, PA-C as PCP - General (Family Medicine)  Extended Emergency Contact Information Primary Emergency Contact: Everrett Coom[place] Home Phone: [phone] Relation: Daughter  Code Status: DNR  Goals of care: Advanced Directive information Advanced Directives 12/10/2015  Does patient have an advance directive? Yes  Type of Advance Directive Out of facility DNR (pink MOST or yellow form)  Does patient want to make changes to advanced directive? No - Patient declined  Copy of advanced directive(s) in chart? Yes  Would patient like information on creating an advanced directive? -     Chief Complaint  Patient presents with  . Medical Management of Chronic Issues    HPI:  Pt is a 82 y.o. female seen today at [facility] for medical management of chronic diseases. She has a medical history of  Depression, Hypothyroidism, GERD, constipation among other conditions. She is seen in her room today. She denies any acute issues this visit. Right hip pain under control.She has completed her PT/OT and ST services. She continues to ambulate with a walker. No recent fall episodes reported. No hospital admission since last visit one month ago. Facility Nurse reports no new concerns.    Past Medical History:  Diagnosis Date  . Constipation   . Dementia   . Depression   . GERD (gastroesophageal reflux disease)   . Thyroid disease    Past Surgical History:  Procedure Laterality Date  . ABDOMINAL HYSTERECTOMY  1980   total  . CHOLECYSTECTOMY  1990  . INTRAMEDULLARY (IM) NAIL INTERTROCHANTERIC Right 11/15/2015   Procedure: INTRAMEDULLARY (IM) NAIL INTERTROCHANTRIC;  Surgeon: Christena Flake, MD;  Location: [facility] ORS;  Service: Orthopedics;  Laterality: Right;     Allergies  Allergen Reactions  . Erythromycin     Patient unsure of reaction      Medication List       Accurate as of 01/16/16  5:11 PM. Always use your most recent med list.          levothyroxine 125 MCG tablet Commonly known as:  SYNTHROID, LEVOTHROID Take 1 tablet (125 mcg total) by mouth daily before breakfast.   magnesium hydroxide 400 MG/5ML suspension Commonly known as:  MILK OF MAGNESIA Take 30 mLs by mouth daily as needed for mild constipation.   Melatonin 3 MG Tabs Take 1 tablet (3 mg total) by mouth at bedtime.   omeprazole 20 MG capsule Commonly known as:  PRILOSEC Take 20 mg by mouth daily.   oxyCODONE 5 MG immediate release tablet Commonly known as:  Oxy IR/ROXICODONE Take 1-2 tablets (5-10 mg total) by mouth every 3 (three) hours as needed for breakthrough pain.   OXYGEN Inhale into the lungs. 2L per nasal cannula PRN for dyspnea/SOB. Notify physician if O2 sat cannot be elevated to 90 or greater.   PARoxetine 20 MG tablet Commonly known as:  PAXIL Take 20 mg by mouth daily.   POLYETHYLENE GLYCOL 3350 PO Take 17 g by mouth daily.   SENNA S PO Take 2 tablets by mouth daily as needed.   traZODone 150 MG tablet Commonly known as:  DESYREL Take 150 mg by mouth at bedtime.   UNABLE TO FIND Med Name: Med pass 2.0 Drink  120 mls by mouth twice daily  Review of Systems  Constitutional: Negative for activity change, appetite change, chills and fever.  HENT: Negative for congestion, rhinorrhea, sinus pressure, sneezing and sore throat.   Eyes: Negative.   Respiratory: Negative for cough, chest tightness, shortness of breath and wheezing.   Cardiovascular: Negative for chest pain, palpitations and leg swelling.  Gastrointestinal: Negative for abdominal distention, constipation, diarrhea and nausea.  Endocrine: Negative.   Genitourinary: Negative for dysuria, frequency and urgency.  Musculoskeletal: Positive for gait problem.        Right hip controlled with current regimen.   Skin: Negative for color change, pallor and rash.  Neurological: Negative for dizziness, seizures, syncope, light-headedness and headaches.  Hematological: Does not bruise/bleed easily.  Psychiatric/Behavioral: Negative for agitation, confusion, hallucinations and sleep disturbance. The patient is not nervous/anxious.     Immunization History  Administered Date(s) Administered  . Influenza, High Dose Seasonal PF 02/20/2015  . Pneumococcal Conjugate-13 02/15/2014  . Pneumococcal Polysaccharide-23 02/20/2015   Pertinent  Health Maintenance Due  Topic Date Due  . DEXA SCAN  07/27/1993  . INFLUENZA VACCINE  11/13/2015  . PNA vac Low Risk Adult  Completed   Fall Risk  02/20/2015  Falls in the past year? No      Vitals:   01/16/16 1015  BP: (!) 146/78  Pulse: 70  Resp: 20  Temp: 97.6 F (36.4 C)  SpO2: 98%  Weight: 153 lb 9.6 oz (69.7 kg)  Height: 5\' 5"  (1.651 m)   Body mass index is 25.56 kg/m. Physical Exam  Constitutional: She is oriented to person, place, and time. She appears well-developed and well-nourished. No distress.  Quiet in bed.   HENT:  Head: Normocephalic.  Mouth/Throat: Oropharynx is clear and moist. No oropharyngeal exudate.  Eyes: Conjunctivae and EOM are normal. Pupils are equal, round, and reactive to light. Right eye exhibits no discharge. Left eye exhibits no discharge. No scleral icterus.  Neck: Normal range of motion. No JVD present. No thyromegaly present.  Cardiovascular: Normal rate, regular rhythm, normal heart sounds and intact distal pulses.  Exam reveals no gallop and no friction rub.   No murmur heard. Pulmonary/Chest: Effort normal and breath sounds normal.  Abdominal: Soft. Bowel sounds are normal. She exhibits no distension. There is no tenderness. There is no rebound and no guarding.  Genitourinary:  Genitourinary Comments: Continent for both bowel and bladder  Musculoskeletal: She exhibits  no edema, tenderness or deformity.  Moves X 4 extremities. Unsteady gait uses FWW.   Lymphadenopathy:    She has no cervical adenopathy.  Neurological: She is oriented to person, place, and time.  Skin: Skin is warm and dry. No rash noted. No erythema. No pallor.  Right hip surgical incision healed.   Psychiatric: She has a normal mood and affect.    Labs reviewed:  Recent Labs  11/16/15 0535 11/17/15 0400 11/18/15 0439  NA 137 138 137  K 3.8 4.0 4.2  CL 107 107 104  CO2 28 28 26   GLUCOSE 121* 126* 114*  BUN 10 10 14   CREATININE 0.65 0.83 0.74  CALCIUM 7.7* 8.0* 8.3*    Recent Labs  11/15/15 1001 11/16/15 0535 11/17/15 0400 11/18/15 0439  WBC 11.5* 6.3 8.7 8.2  NEUTROABS 10.5*  --   --   --   HGB 15.5 10.8* 10.5* 9.8*  HCT 44.4 30.3* 30.7* 27.8*  MCV 93.8 95.3 97.1 95.4  PLT 216 163 182 193   Assessment/Plan 1. Adult hypothyroidism Continue on levothyroxine 125 mcg Tablet. Monitor  TSH level.    2. Gastroesophageal reflux disease without esophagitis Stable. Continue on Omeprazole.   3. Slow transit constipation Current regimen effective. Continue to encourage hydration.   4. Depression, unspecified depression type Stable. Continue on Paxil 20 mg tablet. Monitor for mood changes.   5. Unsteady gait Continue to use FWW. Has completed skilled therapy. Fall and safety precautions.     Family/ staff Communication: Reviewed plan of care with patient and facility Nurse supervisor.   Labs/tests ordered:  None

## 2016-02-04 LAB — TSH: TSH: 0.31 u[IU]/mL — AB (ref 0.41–5.90)

## 2016-02-15 ENCOUNTER — Encounter: Payer: Self-pay | Admitting: Family

## 2016-02-15 ENCOUNTER — Non-Acute Institutional Stay (SKILLED_NURSING_FACILITY): Payer: Medicare Other | Admitting: Family

## 2016-02-15 DIAGNOSIS — K219 Gastro-esophageal reflux disease without esophagitis: Secondary | ICD-10-CM

## 2016-02-15 DIAGNOSIS — F32A Depression, unspecified: Secondary | ICD-10-CM

## 2016-02-15 DIAGNOSIS — E039 Hypothyroidism, unspecified: Secondary | ICD-10-CM

## 2016-02-15 DIAGNOSIS — K5901 Slow transit constipation: Secondary | ICD-10-CM | POA: Diagnosis not present

## 2016-02-15 DIAGNOSIS — R2681 Unsteadiness on feet: Secondary | ICD-10-CM

## 2016-02-15 DIAGNOSIS — F329 Major depressive disorder, single episode, unspecified: Secondary | ICD-10-CM | POA: Diagnosis not present

## 2016-02-15 NOTE — Progress Notes (Signed)
Location:  [facility] Room Number: 208-P Place of Service:  SNF [phone](31) Provider: Dinah Ngetich FNP-C   Margaretann LovelessJennifer M Burnette, PA-C  Patient Care Team: Margaretann LovelessJennifer M Burnette, PA-C as PCP - General (Family Medicine)  Extended Emergency Contact Information Primary Emergency Contact: Everrett CoombeQualls,Kathy  [place] Home Phone: [phone] Relation: Daughter  Code Status:  DNR  Goals of care: Advanced Directive information Advanced Directives 12/10/2015  Does patient have an advance directive? Yes  Type of Advance Directive Out of facility DNR (pink MOST or yellow form)  Does patient want to make changes to advanced directive? No - Patient declined  Copy of advanced directive(s) in chart? Yes  Would patient like information on creating an advanced directive? -     Chief Complaint  Patient presents with  . Medical Management of Chronic Issues    Routine Visit    HPI:  Pt is a 82 y.o. female seen today at [facility] for medical management of chronic diseases. She has a medical history of Hypothyroidism, depression, GERD, hip Fx, Dementia hyperlipidemia among other conditions. She is seen in her room today. She denies any acute issues this visit.Right hip controlled with current regimen.  Her weight stable since last visit a month ago. Facility Nurse reports patient lost balance four days ago while walking on facility hallway. Patient lowered self down to the floor. No injuries sustained. occasional confusion also reported due to dementia. No recent hospital visit.Afebrile.    Past Medical History:  Diagnosis Date  . Constipation   . Dementia   . Depression   . GERD (gastroesophageal reflux disease)   . Thyroid disease    Past Surgical History:  Procedure Laterality Date  . ABDOMINAL HYSTERECTOMY  1980   total  . CHOLECYSTECTOMY  1990  . INTRAMEDULLARY (IM) NAIL INTERTROCHANTERIC Right 11/15/2015   Procedure:  INTRAMEDULLARY (IM) NAIL INTERTROCHANTRIC;  Surgeon: Christena FlakeJohn J Poggi, MD;  Location: [facility] ORS;  Service: Orthopedics;  Laterality: Right;    Allergies  Allergen Reactions  . Erythromycin     Patient unsure of reaction      Medication List       Accurate as of 02/15/16 12:36 PM. Always use your most recent med list.          levothyroxine 125 MCG tablet Commonly known as:  SYNTHROID, LEVOTHROID Take 1 tablet (125 mcg total) by mouth daily before breakfast.   magnesium hydroxide 400 MG/5ML suspension Commonly known as:  MILK OF MAGNESIA Take 30 mLs by mouth daily as needed for mild constipation.   Melatonin 3 MG Tabs Take 1 tablet (3 mg total) by mouth at bedtime.   omeprazole 20 MG capsule Commonly known as:  PRILOSEC Take 20 mg by mouth daily.   oxyCODONE 5 MG immediate release tablet Commonly known as:  Oxy IR/ROXICODONE Take 1-2 tablets (5-10 mg total) by mouth every 3 (three) hours as needed for breakthrough pain.   OXYGEN Inhale into the lungs. 2L per nasal cannula PRN for dyspnea/SOB. Notify physician if O2 sat cannot be elevated to 90 or greater.   PARoxetine 20 MG tablet Commonly known as:  PAXIL Take 20 mg by mouth daily.   POLYETHYLENE GLYCOL 3350 PO Take 17 g by mouth daily.   SENNA S PO Take 2 tablets by mouth daily as needed.   traZODone 150 MG tablet Commonly known as:  DESYREL Take 150 mg by mouth at bedtime.   UNABLE TO FIND  Med Name: Med pass 2.0 Drink  120 mls by mouth twice daily       Review of Systems  Constitutional: Negative for activity change, appetite change, chills and fever.  HENT: Negative for congestion, rhinorrhea, sinus pressure, sneezing and sore throat.   Eyes: Negative.   Respiratory: Negative for cough, chest tightness, shortness of breath and wheezing.   Cardiovascular: Negative for chest pain, palpitations and leg swelling.  Gastrointestinal: Negative for abdominal distention, constipation, diarrhea and nausea.    Endocrine: Negative.   Genitourinary: Negative for dysuria, frequency and urgency.  Musculoskeletal: Positive for gait problem.       Right Hip pain   Skin: Negative for color change, pallor and rash.  Neurological: Negative for dizziness, seizures, syncope, light-headedness and headaches.  Hematological: Does not bruise/bleed easily.  Psychiatric/Behavioral: Negative for agitation, confusion, hallucinations and sleep disturbance. The patient is not nervous/anxious.     Immunization History  Administered Date(s) Administered  . Influenza, High Dose Seasonal PF 02/20/2015  . Pneumococcal Conjugate-13 02/15/2014  . Pneumococcal Polysaccharide-23 02/20/2015   Pertinent  Health Maintenance Due  Topic Date Due  . DEXA SCAN  07/27/1993  . INFLUENZA VACCINE  11/13/2015  . PNA vac Low Risk Adult  Completed   Fall Risk  02/20/2015  Falls in the past year? No      Vitals:   02/15/16 1227  BP: 128/72  Pulse: 72  Resp: 20  Temp: 98.2 F (36.8 C)  TempSrc: Oral  SpO2: 98%  Weight: 153 lb 9.6 oz (69.7 kg)  Height: 5\' 5"  (1.651 m)   Body mass index is 25.56 kg/m. Physical Exam  Constitutional: She is oriented to person, place, and time. She appears well-developed and well-nourished. No distress.  HENT:  Head: Normocephalic.  Mouth/Throat: Oropharynx is clear and moist. No oropharyngeal exudate.  Eyes: Conjunctivae and EOM are normal. Pupils are equal, round, and reactive to light. Right eye exhibits no discharge. Left eye exhibits no discharge. No scleral icterus.  Neck: Normal range of motion. No JVD present. No thyromegaly present.  Cardiovascular: Normal rate, regular rhythm, normal heart sounds and intact distal pulses.  Exam reveals no gallop and no friction rub.   No murmur heard. Pulmonary/Chest: Effort normal and breath sounds normal.  Abdominal: Soft. Bowel sounds are normal. She exhibits no distension. There is no tenderness. There is no rebound and no guarding.   Genitourinary:  Genitourinary Comments: Continent for both bowel and bladder  Musculoskeletal: She exhibits no edema, tenderness or deformity.  Moves X 4 extremities.unsteady gait uses FWW.   Lymphadenopathy:    She has no cervical adenopathy.  Neurological: She is oriented to person, place, and time.  Skin: Skin is warm and dry. No rash noted. No erythema. No pallor.   Skin intact   Psychiatric: She has a normal mood and affect.    Labs reviewed:  Recent Labs  11/16/15 0535 11/17/15 0400 11/18/15 0439  NA 137 138 137  K 3.8 4.0 4.2  CL 107 107 104  CO2 28 28 26   GLUCOSE 121* 126* 114*  BUN 10 10 14   CREATININE 0.65 0.83 0.74  CALCIUM 7.7* 8.0* 8.3*    Recent Labs  11/15/15 1001 11/16/15 0535 11/17/15 0400 11/18/15 0439 12/11/15  WBC 11.5* 6.3 8.7 8.2 6.8  NEUTROABS 10.5*  --   --   --   --   HGB 15.5 10.8* 10.5* 9.8* 11.4*  HCT 44.4 30.3* 30.7* 27.8* 36  MCV 93.8 95.3 97.1 95.4  --  PLT 216 163 182 193 407*   Lab Results  Component Value Date   TSH 0.31 (A) 02/04/2016   Assessment/Plan Hypothyroidism Continue on Levothyroxine. Monitor TSH level.    GERD Omeprazole effective.   Unsteady gait Had loss of balance 4 days ago. Lowered self to the floor. No injuries sustained. Continue fall and safety precautions.   Depression Stable.continue on Paxil and Trazodone. Monitor mood changes.   Constipation  Current regimen effective.  Family/ staff Communication: Reviewed plan of care with patient and facility Nurse supervisor.   Labs/tests ordered: None

## 2016-03-18 ENCOUNTER — Non-Acute Institutional Stay (SKILLED_NURSING_FACILITY): Payer: Medicare Other | Admitting: Family

## 2016-03-18 DIAGNOSIS — K219 Gastro-esophageal reflux disease without esophagitis: Secondary | ICD-10-CM

## 2016-03-18 DIAGNOSIS — K5901 Slow transit constipation: Secondary | ICD-10-CM | POA: Diagnosis not present

## 2016-03-18 DIAGNOSIS — R2681 Unsteadiness on feet: Secondary | ICD-10-CM

## 2016-03-18 DIAGNOSIS — L602 Onychogryphosis: Secondary | ICD-10-CM | POA: Diagnosis not present

## 2016-03-18 DIAGNOSIS — E559 Vitamin D deficiency, unspecified: Secondary | ICD-10-CM | POA: Diagnosis not present

## 2016-03-18 DIAGNOSIS — E538 Deficiency of other specified B group vitamins: Secondary | ICD-10-CM | POA: Diagnosis not present

## 2016-03-18 DIAGNOSIS — E039 Hypothyroidism, unspecified: Secondary | ICD-10-CM

## 2016-03-18 NOTE — Progress Notes (Addendum)
Location:  [facility] Room Number: 208  Place of Service:  SNF [phone]) Provider: Omer Puccinelli FNP-C   Margaretann LovelessJennifer M Burnette, PA-C  Patient Care Team: Margaretann LovelessJennifer M Burnette, PA-C as PCP - General (Family Medicine)  Extended Emergency Contact Information Primary Emergency Contact: Everrett CoombeQualls,Kathy  [place] Home Phone: [phone] Relation: Daughter  Code Status:  DNR  Goals of care: Advanced Directive information Advanced Directives 12/10/2015  Does Patient Have a Medical Advance Directive? Yes  Type of Advance Directive Out of facility DNR (pink MOST or yellow form)  Does patient want to make changes to medical advance directive? No - Patient declined  Copy of Healthcare Power of Attorney in Chart? Yes  Would patient like information on creating a medical advance directive? -     Chief Complaint  Patient presents with  . Medical Management of Chronic Issues    HPI:  Pt is a 82 y.o. female seen today at [facility] for medical management of chronic diseases.She has a medical history of Hypothyroidism, GERD, Depression among other conditions. She is seen in her room today. She denies any acute issues this visit. No recent fall episodes since prior visit. No weight changes or Hospital admission. Facility Nurse reports no new concerns.    Past Medical History:  Diagnosis Date  . Constipation   . Dementia   . Depression   . GERD (gastroesophageal reflux disease)   . Thyroid disease    Past Surgical History:  Procedure Laterality Date  . ABDOMINAL HYSTERECTOMY  1980   total  . CHOLECYSTECTOMY  1990  . INTRAMEDULLARY (IM) NAIL INTERTROCHANTERIC Right 11/15/2015   Procedure: INTRAMEDULLARY (IM) NAIL INTERTROCHANTRIC;  Surgeon: Christena FlakeJohn J Poggi, MD;  Location: [facility] ORS;  Service: Orthopedics;  Laterality: Right;    Allergies  Allergen Reactions  . Erythromycin     Patient unsure of reaction      Medication List       Accurate as of 03/18/16  6:43 PM. Always use your most recent med list.          levothyroxine 125 MCG tablet Commonly known as:  SYNTHROID, LEVOTHROID Take 1 tablet (125 mcg total) by mouth daily before breakfast.   magnesium hydroxide 400 MG/5ML suspension Commonly known as:  MILK OF MAGNESIA Take 30 mLs by mouth daily as needed for mild constipation.   Melatonin 3 MG Tabs Take 1 tablet (3 mg total) by mouth at bedtime.   omeprazole 20 MG capsule Commonly known as:  PRILOSEC Take 20 mg by mouth daily.   oxyCODONE 5 MG immediate release tablet Commonly known as:  Oxy IR/ROXICODONE Take 1-2 tablets (5-10 mg total) by mouth every 3 (three) hours as needed for breakthrough pain.   OXYGEN Inhale into the lungs. 2L per nasal cannula PRN for dyspnea/SOB. Notify physician if O2 sat cannot be elevated to 90 or greater.   PARoxetine 20 MG tablet Commonly known as:  PAXIL Take 20 mg by mouth daily.   POLYETHYLENE GLYCOL 3350 PO Take 17 g by mouth daily.   SENNA S PO Take 2 tablets by mouth daily as needed.   traZODone 150 MG tablet Commonly known as:  DESYREL Take 150 mg by mouth at bedtime.   UNABLE TO FIND Med Name: Med pass 2.0 Drink  120 mls by mouth twice daily       Review of Systems  Constitutional: Negative for activity change, appetite change, chills and fever.  HENT:  Negative for congestion, rhinorrhea, sinus pressure, sneezing and sore throat.   Eyes: Negative.   Respiratory: Negative for cough, chest tightness, shortness of breath and wheezing.   Cardiovascular: Negative for chest pain, palpitations and leg swelling.  Gastrointestinal: Negative for abdominal distention, abdominal pain, constipation, diarrhea, nausea and vomiting.  Endocrine: Negative for cold intolerance, heat intolerance, polydipsia, polyphagia and polyuria.  Genitourinary: Negative for dysuria, frequency and urgency.  Musculoskeletal: Positive for gait problem.       Right Hip pain  under control.    Skin: Negative for color change, pallor and rash.  Neurological: Negative for dizziness, seizures, syncope, light-headedness and headaches.  Hematological: Does not bruise/bleed easily.  Psychiatric/Behavioral: Negative for agitation, confusion, hallucinations and sleep disturbance. The patient is not nervous/anxious.     Immunization History  Administered Date(s) Administered  . Influenza, High Dose Seasonal PF 02/20/2015  . Pneumococcal Conjugate-13 02/15/2014  . Pneumococcal Polysaccharide-23 02/20/2015   Pertinent  Health Maintenance Due  Topic Date Due  . DEXA SCAN  07/27/1993  . INFLUENZA VACCINE  11/13/2015  . PNA vac Low Risk Adult  Completed   Fall Risk  02/20/2015  Falls in the past year? No      Vitals:   03/18/16 1315  BP: 132/62  Pulse: 78  Resp: 20  Temp: 98.2 F (36.8 C)  SpO2: 97%  Weight: 158 lb 3.2 oz (71.8 kg)  Height: 5\' 5"  (1.651 m)   Body mass index is 26.33 kg/m. Physical Exam  Constitutional: She is oriented to person, place, and time. She appears well-developed and well-nourished. No distress.  Pleasant Elderly in no acute distress.   HENT:  Head: Normocephalic.  Mouth/Throat: Oropharynx is clear and moist. No oropharyngeal exudate.  Eyes: Conjunctivae and EOM are normal. Pupils are equal, round, and reactive to light. Right eye exhibits no discharge. Left eye exhibits no discharge. No scleral icterus.  Neck: Normal range of motion. No JVD present. No thyromegaly present.  Cardiovascular: Normal rate, regular rhythm, normal heart sounds and intact distal pulses.  Exam reveals no gallop and no friction rub.   No murmur heard. Pulmonary/Chest: Effort normal and breath sounds normal.  Abdominal: Soft. Bowel sounds are normal. She exhibits no distension. There is no tenderness. There is no rebound and no guarding.  Genitourinary:  Genitourinary Comments: Continent.   Musculoskeletal: She exhibits no edema, tenderness or  deformity.  unsteady gait uses FWW   Lymphadenopathy:    She has no cervical adenopathy.  Neurological: She is oriented to person, place, and time.  Skin: Skin is warm and dry. No rash noted. No erythema. No pallor.  Intact skin.   Psychiatric: She has a normal mood and affect.    Labs reviewed:  Recent Labs  11/16/15 0535 11/17/15 0400 11/18/15 0439  NA 137 138 137  K 3.8 4.0 4.2  CL 107 107 104  CO2 28 28 26   GLUCOSE 121* 126* 114*  BUN 10 10 14   CREATININE 0.65 0.83 0.74  CALCIUM 7.7* 8.0* 8.3*    Recent Labs  11/15/15 1001 11/16/15 0535 11/17/15 0400 11/18/15 0439 12/11/15  WBC 11.5* 6.3 8.7 8.2 6.8  NEUTROABS 10.5*  --   --   --   --   HGB 15.5 10.8* 10.5* 9.8* 11.4*  HCT 44.4 30.3* 30.7* 27.8* 36  MCV 93.8 95.3 97.1 95.4  --   PLT 216 163 182 193 407*   Lab Results  Component Value Date   TSH 0.31 (A) 02/04/2016  Assessment/Plan 1. Adult hypothyroidism Previous TSH 0.31 ( 02/04/2016). TSH level rechecked 04/03/2016.   2. B12 deficiency Obtain vit B12 level 04/03/2016.   3. Vitamin D deficiency Vit D level 04/03/2016  4. Unsteady gait Uses FWW. No recent fall episodes. Continue restorative exercises. Fall and safety precautions.   5. Gastroesophageal reflux disease without esophagitis Continue on omeprazole. Obtain Mg level 04/03/2016.   6. Slow transit constipation Current regimen effective. Encourage oral and fluid intake.   7. Overgrown Toenails Refer to podiatrist to trim overgrown toenails.     Family/ staff Communication:Reviewed plan of care with patient and facility Nurse supervisor.  Labs/tests ordered:  TSH level,Vit D level, Vit B12 and Mg level 04/03/2016.

## 2016-04-03 LAB — VITAMIN B12: VITAMIN B 12: 97

## 2016-04-03 LAB — TSH: TSH: 0.39 u[IU]/mL — AB (ref 0.41–5.90)

## 2016-04-03 LAB — VITAMIN D 25 HYDROXY (VIT D DEFICIENCY, FRACTURES): Vit D, 25-Hydroxy: 15.95

## 2016-04-17 ENCOUNTER — Non-Acute Institutional Stay (SKILLED_NURSING_FACILITY): Payer: Medicare Other | Admitting: Family

## 2016-04-17 ENCOUNTER — Encounter: Payer: Self-pay | Admitting: Family

## 2016-04-17 DIAGNOSIS — E559 Vitamin D deficiency, unspecified: Secondary | ICD-10-CM | POA: Diagnosis not present

## 2016-04-17 DIAGNOSIS — E039 Hypothyroidism, unspecified: Secondary | ICD-10-CM

## 2016-04-17 DIAGNOSIS — E538 Deficiency of other specified B group vitamins: Secondary | ICD-10-CM | POA: Diagnosis not present

## 2016-04-17 MED ORDER — VITAMIN D3 25 MCG (1000 UNIT) PO TABS
1000.0000 [IU] | ORAL_TABLET | Freq: Every day | ORAL | Status: AC
Start: 2016-04-17 — End: ?

## 2016-04-17 MED ORDER — VITAMIN B-12 1000 MCG PO TABS: 1000.0000 ug | ORAL_TABLET | Freq: Every day | ORAL | Status: AC

## 2016-04-17 NOTE — Progress Notes (Signed)
Location:  [facility] Room Number: 208 Place of Service:  SNF (31)  Provider: Richarda Bladeinah Kolston Lacount, FNP-C   Oneal GroutPANDEY, MAHIMA, MD  Patient Care Team: Oneal GroutMahima Pandey, MD as PCP - General (Internal Medicine)  Extended Emergency Contact Information Primary Emergency Contact: Everrett CoombeQualls,Kathy  [place] Home Phone: [phone] Relation: Daughter  Code Status:  DNR Goals of care: Advanced Directive information Advanced Directives 04/17/2016  Does Patient Have a Medical Advance Directive? Yes  Type of Advance Directive Out of facility DNR (pink MOST or yellow form)  Does patient want to make changes to medical advance directive? -  Copy of Healthcare Power of Attorney in Chart? -  Would patient like information on creating a medical advance directive? -  Pre-existing out of facility DNR order (yellow form or pink MOST form) Yellow form placed in chart (order not valid for inpatient use)     Chief Complaint  Patient presents with  . Medical Management of Chronic Issues    routine visit    HPI:  Pt is a 83 y.o. female seen today at [facility]  for medical management of chronic diseases. She has a medical history of Dementia, Hypothyroidism, Depression, GERD among other conditions. She seen in her room today. She denies any acute issues this visit.No recent fall episodes or weight changes reported.     Past Medical History:  Diagnosis Date  . Constipation   . Dementia   . Depression   . GERD (gastroesophageal reflux disease)   . Thyroid disease    Past Surgical History:  Procedure Laterality Date  . ABDOMINAL HYSTERECTOMY  1980   total  . CHOLECYSTECTOMY  1990  . INTRAMEDULLARY (IM) NAIL INTERTROCHANTERIC Right 11/15/2015   Procedure: INTRAMEDULLARY (IM) NAIL INTERTROCHANTRIC;  Surgeon: Christena FlakeJohn J Poggi, MD;  Location: [facility] ORS;  Service: Orthopedics;  Laterality: Right;    Allergies  Allergen Reactions  . Erythromycin    Patient unsure of reaction    Allergies as of 04/17/2016      Reactions   Erythromycin    Patient unsure of reaction      Medication List       Accurate as of 04/17/16  6:22 PM. Always use your most recent med list.          levothyroxine 125 MCG tablet Commonly known as:  SYNTHROID, LEVOTHROID Take 1 tablet (125 mcg total) by mouth daily before breakfast.   magnesium hydroxide 400 MG/5ML suspension Commonly known as:  MILK OF MAGNESIA Take 30 mLs by mouth daily as needed for mild constipation.   Melatonin 3 MG Tabs Take 1 tablet (3 mg total) by mouth at bedtime.   omeprazole 20 MG capsule Commonly known as:  PRILOSEC Take 20 mg by mouth daily.   oxyCODONE 5 MG immediate release tablet Commonly known as:  Oxy IR/ROXICODONE Take 1-2 tablets (5-10 mg total) by mouth every 3 (three) hours as needed for breakthrough pain.   OXYGEN Inhale into the lungs. 2L per nasal cannula PRN for dyspnea/SOB. Notify physician if O2 sat cannot be elevated to 90 or greater.   PARoxetine 20 MG tablet Commonly known as:  PAXIL Take 20 mg by mouth daily.   POLYETHYLENE GLYCOL 3350 PO Take 17 g by mouth daily.   SENNA S PO Take 2 tablets by mouth daily as needed.   traZODone 150 MG tablet Commonly known as:  DESYREL Take 150 mg by mouth at bedtime.   UNABLE TO  FIND Med Name: Med pass 2.0 Drink  120 mls by mouth twice daily       Review of Systems  Constitutional: Negative for activity change, appetite change, chills and fever.  HENT: Negative for congestion, rhinorrhea, sinus pressure, sneezing and sore throat.   Eyes: Negative.   Respiratory: Negative for cough, chest tightness, shortness of breath and wheezing.   Cardiovascular: Negative for chest pain, palpitations and leg swelling.  Gastrointestinal: Negative for abdominal distention, abdominal pain, constipation, diarrhea, nausea and vomiting.  Endocrine: Negative for cold intolerance, heat intolerance, polydipsia,  polyphagia and polyuria.  Genitourinary: Negative for dysuria, frequency and urgency.  Musculoskeletal: Positive for gait problem.       Right Hip pain under control.    Skin: Negative for color change, pallor and rash.  Neurological: Negative for dizziness, seizures, syncope, light-headedness and headaches.  Hematological: Does not bruise/bleed easily.  Psychiatric/Behavioral: Negative for agitation, confusion, hallucinations and sleep disturbance. The patient is not nervous/anxious.     Immunization History  Administered Date(s) Administered  . Influenza, High Dose Seasonal PF 02/20/2015  . Pneumococcal Conjugate-13 02/15/2014  . Pneumococcal Polysaccharide-23 02/20/2015   Pertinent  Health Maintenance Due  Topic Date Due  . DEXA SCAN  07/27/1993  . INFLUENZA VACCINE  11/13/2015  . PNA vac Low Risk Adult  Completed   Fall Risk  02/20/2015  Falls in the past year? No    Vitals:   04/17/16 1053  BP: 136/64  Pulse: 75  Resp: 18  Temp: 98.9 F (37.2 C)  SpO2: 93%  Weight: 158 lb (71.7 kg)  Height: 5\' 5"  (1.651 m)   Body mass index is 26.29 kg/m. Physical Exam  Constitutional: She is oriented to person, place, and time. She appears well-developed and well-nourished. No distress.  Elderly  HENT:  Head: Normocephalic.  Mouth/Throat: Oropharynx is clear and moist. No oropharyngeal exudate.  Eyes: Conjunctivae and EOM are normal. Pupils are equal, round, and reactive to light. Right eye exhibits no discharge. Left eye exhibits no discharge. No scleral icterus.  Neck: Normal range of motion. No JVD present. No thyromegaly present.  Cardiovascular: Normal rate, regular rhythm, normal heart sounds and intact distal pulses.  Exam reveals no gallop and no friction rub.   No murmur heard. Pulmonary/Chest: Effort normal and breath sounds normal.  Abdominal: Soft. Bowel sounds are normal. She exhibits no distension. There is no tenderness. There is no rebound and no guarding.    Genitourinary:  Genitourinary Comments: Continent.   Musculoskeletal: She exhibits no edema, tenderness or deformity.  unsteady gait   Lymphadenopathy:    She has no cervical adenopathy.  Neurological: She is oriented to person, place, and time.  Skin: Skin is warm and dry. No rash noted. No erythema. No pallor.  Skin intact.    Psychiatric: She has a normal mood and affect.    Labs reviewed:  Recent Labs  11/16/15 0535 11/17/15 0400 11/18/15 0439  NA 137 138 137  K 3.8 4.0 4.2  CL 107 107 104  CO2 28 28 26   GLUCOSE 121* 126* 114*  BUN 10 10 14   CREATININE 0.65 0.83 0.74  CALCIUM 7.7* 8.0* 8.3*    Recent Labs  11/15/15 1001 11/16/15 0535 11/17/15 0400 11/18/15 0439 12/11/15  WBC 11.5* 6.3 8.7 8.2 6.8  NEUTROABS 10.5*  --   --   --   --   HGB 15.5 10.8* 10.5* 9.8* 11.4*  HCT 44.4 30.3* 30.7* 27.8* 36  MCV 93.8 95.3 97.1 95.4  --  PLT 216 163 182 193 407*   Lab Results  Component Value Date   TSH 0.39 (A) 04/03/2016   Assessment/Plan Hypothyroidism Recent TSH level Wnl. Continue on levothyroxine 125 mcg Tablet. Monitor TSH level.   Vit B12 deficiency Recent Vit B12 level 97 (03/2016). Start Vit B12 1000 mcg Tablet one by mouth daily. Recheck Vit B12 level in 3 months.   Vitamin D deficiency  Vit D level 19 ( 03/2016). Start on vit D 1000 units Tablet one by mouth daily. Recheck Vit D level and Vit B12 level  in 3 months.   Family/ staff Communication: Reviewed plan of care with patient and facility Nurse supervisor.   Labs/tests ordered:  Vit D level in 3 months.

## 2016-05-12 ENCOUNTER — Non-Acute Institutional Stay (SKILLED_NURSING_FACILITY): Payer: Medicare Other | Admitting: Family

## 2016-05-12 DIAGNOSIS — R296 Repeated falls: Secondary | ICD-10-CM

## 2016-05-12 DIAGNOSIS — R2681 Unsteadiness on feet: Secondary | ICD-10-CM

## 2016-05-12 DIAGNOSIS — Z043 Encounter for examination and observation following other accident: Secondary | ICD-10-CM

## 2016-05-12 NOTE — Progress Notes (Signed)
Location:  [facility] Room Number: 208  Place of Service:  SNF (31)  Provider: Richarda Blade, FNP-C   Oneal Grout, MD  Patient Care Team: Oneal Grout, MD as PCP - General (Internal Medicine)  Extended Emergency Contact Information Primary Emergency Contact: Everrett Coombe [place] Home Phone: [phone] Relation: Daughter  Code Status:  DNR Goals of care: Advanced Directive information Advanced Directives 04/17/2016  Does Patient Have a Medical Advance Directive? Yes  Type of Advance Directive Out of facility DNR (pink MOST or yellow form)  Does patient want to make changes to medical advance directive? -  Copy of Healthcare Power of Attorney in Chart? -  Would patient like information on creating a medical advance directive? -  Pre-existing out of facility DNR order (yellow form or pink MOST form) Yellow form placed in chart (order not valid for inpatient use)     Chief Complaint  Patient presents with  . Acute Visit    follow up fall     HPI:  Pt is a 83 y.o. female seen today at [facility] for follow up fall.She has a medical history of Dementia, Hypothyroidism, Depression, GERD among other conditions. She seen in her room today. She denies any acute issues this visit.Facility Nurse reports patient was found on the floor in the bathroom by house keeping staff. No acute injuries reported.      Past Medical History:  Diagnosis Date  . Constipation   . Dementia   . Depression   . GERD (gastroesophageal reflux disease)   . Thyroid disease    Past Surgical History:  Procedure Laterality Date  . ABDOMINAL HYSTERECTOMY  1980   total  . CHOLECYSTECTOMY  1990  . INTRAMEDULLARY (IM) NAIL INTERTROCHANTERIC Right 11/15/2015   Procedure: INTRAMEDULLARY (IM) NAIL INTERTROCHANTRIC;  Surgeon: Christena Flake, MD;  Location: [facility] ORS;  Service: Orthopedics;  Laterality: Right;    Allergies  Allergen  Reactions  . Erythromycin     Patient unsure of reaction    Allergies as of 05/12/2016      Reactions   Erythromycin    Patient unsure of reaction      Medication List       Accurate as of 05/12/16  5:20 PM. Always use your most recent med list.          cholecalciferol 1000 units tablet Commonly known as:  VITAMIN D Take 1 tablet (1,000 Units total) by mouth daily.   levothyroxine 125 MCG tablet Commonly known as:  SYNTHROID, LEVOTHROID Take 1 tablet (125 mcg total) by mouth daily before breakfast.   magnesium hydroxide 400 MG/5ML suspension Commonly known as:  MILK OF MAGNESIA Take 30 mLs by mouth daily as needed for mild constipation.   Melatonin 3 MG Tabs Take 1 tablet (3 mg total) by mouth at bedtime.   omeprazole 20 MG capsule Commonly known as:  PRILOSEC Take 20 mg by mouth daily.   oxyCODONE 5 MG immediate release tablet Commonly known as:  Oxy IR/ROXICODONE Take 1-2 tablets (5-10 mg total) by mouth every 3 (three) hours as needed for breakthrough pain.   OXYGEN Inhale into the lungs. 2L per nasal cannula PRN for dyspnea/SOB. Notify physician if O2 sat cannot be elevated to 90 or greater.   PARoxetine 20 MG tablet Commonly known as:  PAXIL Take 20 mg by mouth daily.   POLYETHYLENE GLYCOL 3350 PO Take 17 g by mouth daily.   SENNA  S PO Take 2 tablets by mouth daily as needed.   traZODone 150 MG tablet Commonly known as:  DESYREL Take 150 mg by mouth at bedtime.   UNABLE TO FIND Med Name: Med pass 2.0 Drink  120 mls by mouth twice daily   vitamin B-12 1000 MCG tablet Commonly known as:  CYANOCOBALAMIN Take 1 tablet (1,000 mcg total) by mouth daily.       Review of Systems  Constitutional: Negative for activity change, appetite change, chills and fever.  HENT: Negative for congestion, rhinorrhea, sinus pressure, sneezing and sore throat.   Eyes: Negative.   Respiratory: Negative for cough, chest tightness, shortness of breath and wheezing.     Cardiovascular: Negative for chest pain, palpitations and leg swelling.  Gastrointestinal: Negative for abdominal distention, abdominal pain, constipation, diarrhea, nausea and vomiting.  Endocrine: Negative for cold intolerance, heat intolerance, polydipsia, polyphagia and polyuria.  Genitourinary: Negative for dysuria, frequency and urgency.  Musculoskeletal: Positive for gait problem.       Previous Right Hip pain under control.    Skin: Negative for color change, pallor and rash.  Neurological: Negative for dizziness, seizures, syncope, light-headedness and headaches.  Hematological: Does not bruise/bleed easily.  Psychiatric/Behavioral: Negative for agitation, confusion, hallucinations and sleep disturbance. The patient is not nervous/anxious.     Immunization History  Administered Date(s) Administered  . Influenza, High Dose Seasonal PF 02/20/2015  . Pneumococcal Conjugate-13 02/15/2014  . Pneumococcal Polysaccharide-23 02/20/2015   Pertinent  Health Maintenance Due  Topic Date Due  . DEXA SCAN  07/27/1993  . INFLUENZA VACCINE  11/13/2015  . PNA vac Low Risk Adult  Completed   Fall Risk  02/20/2015  Falls in the past year? No    Vitals:   05/12/16 1000  BP: 119/70  Pulse: 81  Resp: 18  Temp: 97.2 F (36.2 C)  SpO2: 93%  Weight: 157 lb (71.2 kg)  Height: 5\' 5"  (1.651 m)   Body mass index is 26.13 kg/m. Physical Exam  Constitutional: She is oriented to person, place, and time. She appears well-developed and well-nourished. No distress.  HENT:  Head: Normocephalic.  Mouth/Throat: Oropharynx is clear and moist. No oropharyngeal exudate.  Eyes: Conjunctivae and EOM are normal. Pupils are equal, round, and reactive to light. Right eye exhibits no discharge. Left eye exhibits no discharge. No scleral icterus.  Neck: Normal range of motion. No JVD present. No thyromegaly present.  Cardiovascular: Normal rate, regular rhythm, normal heart sounds and intact distal pulses.   Exam reveals no gallop and no friction rub.   No murmur heard. Pulmonary/Chest: Effort normal and breath sounds normal.  Abdominal: Soft. Bowel sounds are normal. She exhibits no distension. There is no tenderness. There is no rebound and no guarding.  Genitourinary:  Genitourinary Comments: Continent.   Musculoskeletal: She exhibits no edema, tenderness or deformity.  Moves all extremities without any difficulties. unsteady gait   Lymphadenopathy:    She has no cervical adenopathy.  Neurological: She is oriented to person, place, and time.  Skin: Skin is warm and dry. No rash noted. No erythema. No pallor.  Psychiatric: She has a normal mood and affect.    Labs reviewed:  Recent Labs  11/16/15 0535 11/17/15 0400 11/18/15 0439  NA 137 138 137  K 3.8 4.0 4.2  CL 107 107 104  CO2 28 28 26   GLUCOSE 121* 126* 114*  BUN 10 10 14   CREATININE 0.65 0.83 0.74  CALCIUM 7.7* 8.0* 8.3*    Recent  Labs  11/15/15 1001 11/16/15 0535 11/17/15 0400 11/18/15 0439 12/11/15  WBC 11.5* 6.3 8.7 8.2 6.8  NEUTROABS 10.5*  --   --   --   --   HGB 15.5 10.8* 10.5* 9.8* 11.4*  HCT 44.4 30.3* 30.7* 27.8* 36  MCV 93.8 95.3 97.1 95.4  --   PLT 216 163 182 193 407*   Lab Results  Component Value Date   TSH 0.39 (A) 04/03/2016   Assessment/Plan Unsteady gait  Sustained unwitnessed fall episode in the bathroom. Encouraged to call for assistance prior to getting up. PT/OT to evaluate and treat as indicated for ROM, exercise, gait stability and muscle stregthening.   Encounter for Fall   Sustained unwitnessed fall episode in the bathroom found by house keeping. No acute injuries. Will continue to monitor. Fall and safety precautions. PT/OT order as aboved. Will obtain CBC/diff and BMP 05/13/2016  Family/ staff Communication: Reviewed plan of care with patient and facility Nurse supervisor.   Labs/tests ordered:  CBC/diff and BMP 05/13/2016

## 2016-05-13 LAB — BASIC METABOLIC PANEL
BUN: 9 mg/dL (ref 4–21)
Creatinine: 0.8 mg/dL (ref 0.5–1.1)
GLUCOSE: 117 mg/dL
Potassium: 4.3 mmol/L (ref 3.4–5.3)
SODIUM: 142 mmol/L (ref 137–147)

## 2016-05-13 LAB — CBC AND DIFFERENTIAL
HCT: 41 % (ref 36–46)
HEMOGLOBIN: 13.2 g/dL (ref 12.0–16.0)
PLATELETS: 275 10*3/uL (ref 150–399)
WBC: 7.4 10^3/mL

## 2016-05-15 ENCOUNTER — Non-Acute Institutional Stay (SKILLED_NURSING_FACILITY): Payer: Medicare Other | Admitting: Family

## 2016-05-15 ENCOUNTER — Encounter: Payer: Self-pay | Admitting: Family

## 2016-05-15 DIAGNOSIS — E039 Hypothyroidism, unspecified: Secondary | ICD-10-CM

## 2016-05-15 DIAGNOSIS — K5901 Slow transit constipation: Secondary | ICD-10-CM

## 2016-05-15 DIAGNOSIS — L602 Onychogryphosis: Secondary | ICD-10-CM

## 2016-05-15 NOTE — Progress Notes (Signed)
Location:  [facility] Room Number: 208-P Place of Service:  SNF (31)  Provider: Richarda Blade, FNP-C   Oneal Grout, MD  Patient Care Team: Oneal Grout, MD as PCP - General (Internal Medicine)  Extended Emergency Contact Information Primary Emergency Contact: Everrett Coombe [place] Home Phone: [phone] Relation: Daughter  Code Status:  DNR Goals of care: Advanced Directive information Advanced Directives 04/17/2016  Does Patient Have a Medical Advance Directive? Yes  Type of Advance Directive Out of facility DNR (pink MOST or yellow form)  Does patient want to make changes to medical advance directive? -  Copy of Healthcare Power of Attorney in Chart? -  Would patient like information on creating a medical advance directive? -  Pre-existing out of facility DNR order (yellow form or pink MOST form) Yellow form placed in chart (order not valid for inpatient use)     Chief Complaint  Patient presents with  . Medical Management of Chronic Issues    Routine Visit     HPI:  Pt is a 83 y.o. female seen today at [facility]  for medical management of chronic diseases. She has a medical history of Dementia, Hypothyroidism, Depression, GERD among other conditions. She seen in her room today. She denies any acute issues this visit.She continues to have no pain from recent fall episode though she does not recall falling. No weight changes reported.She continues to walk short distance using FWW and self propels on facility hallway.She has not required her Roxicodone for pain over several weeks will discontinue from medication list then continue on tylenol if needed.Facility Nurse reports no new concerns.       Past Medical History:  Diagnosis Date  . Constipation   . Dementia   . Depression   . GERD (gastroesophageal reflux disease)   . Thyroid disease    Past Surgical History:  Procedure Laterality Date  .  ABDOMINAL HYSTERECTOMY  1980   total  . CHOLECYSTECTOMY  1990  . INTRAMEDULLARY (IM) NAIL INTERTROCHANTERIC Right 11/15/2015   Procedure: INTRAMEDULLARY (IM) NAIL INTERTROCHANTRIC;  Surgeon: Christena Flake, MD;  Location: [facility] ORS;  Service: Orthopedics;  Laterality: Right;    Allergies  Allergen Reactions  . Erythromycin     Patient unsure of reaction    Allergies as of 05/15/2016      Reactions   Erythromycin    Patient unsure of reaction      Medication List       Accurate as of 05/15/16  1:16 PM. Always use your most recent med list.          cholecalciferol 1000 units tablet Commonly known as:  VITAMIN D Take 1 tablet (1,000 Units total) by mouth daily.   levothyroxine 125 MCG tablet Commonly known as:  SYNTHROID, LEVOTHROID Take 1 tablet (125 mcg total) by mouth daily before breakfast.   magnesium hydroxide 400 MG/5ML suspension Commonly known as:  MILK OF MAGNESIA Take 30 mLs by mouth daily as needed for mild constipation.   Melatonin 3 MG Tabs Take 1 tablet (3 mg total) by mouth at bedtime.   omeprazole 20 MG capsule Commonly known as:  PRILOSEC Take 20 mg by mouth daily.   oxyCODONE 5 MG immediate release tablet Commonly known as:  Oxy IR/ROXICODONE Take 1-2 tablets (5-10 mg total) by mouth every 3 (three) hours as needed for breakthrough pain.   OXYGEN Inhale into the lungs. 2L per nasal cannula PRN for  dyspnea/SOB. Notify physician if O2 sat cannot be elevated to 90 or greater.   PARoxetine 20 MG tablet Commonly known as:  PAXIL Take 20 mg by mouth daily.   POLYETHYLENE GLYCOL 3350 PO Take 17 g by mouth daily.   SENNA S PO Take 2 tablets by mouth daily as needed.   traZODone 150 MG tablet Commonly known as:  DESYREL Take 150 mg by mouth at bedtime.   UNABLE TO FIND Med Name: Med pass 120 mls by mouth daily   vitamin B-12 1000 MCG tablet Commonly known as:  CYANOCOBALAMIN Take 1 tablet (1,000 mcg total) by mouth daily.       Review of  Systems  Constitutional: Negative for activity change, appetite change, chills and fever.  HENT: Negative for congestion, rhinorrhea, sinus pressure, sneezing and sore throat.   Eyes: Negative.   Respiratory: Negative for cough, chest tightness, shortness of breath and wheezing.   Cardiovascular: Negative for chest pain, palpitations and leg swelling.  Gastrointestinal: Negative for abdominal distention, abdominal pain, constipation, diarrhea, nausea and vomiting.  Endocrine: Negative for cold intolerance, heat intolerance, polydipsia, polyphagia and polyuria.  Genitourinary: Negative for dysuria, frequency and urgency.  Musculoskeletal: Positive for gait problem.  Skin: Negative for color change, pallor and rash.  Neurological: Negative for dizziness, seizures, syncope, light-headedness and headaches.  Hematological: Does not bruise/bleed easily.  Psychiatric/Behavioral: Negative for agitation, confusion, hallucinations and sleep disturbance. The patient is not nervous/anxious.     Immunization History  Administered Date(s) Administered  . Influenza, High Dose Seasonal PF 02/20/2015  . Influenza-Unspecified 05/07/2016  . Pneumococcal Conjugate-13 02/15/2014  . Pneumococcal Polysaccharide-23 02/20/2015   Pertinent  Health Maintenance Due  Topic Date Due  . DEXA SCAN  07/27/1993  . INFLUENZA VACCINE  11/13/2015  . PNA vac Low Risk Adult  Completed   Fall Risk  02/20/2015  Falls in the past year? No    Vitals:   05/15/16 1311  BP: 134/66  Pulse: 75  Resp: 18  Temp: 97.3 F (36.3 C)  TempSrc: Oral  Weight: 157 lb (71.2 kg)  Height: 5\' 5"  (1.651 m)   Body mass index is 26.13 kg/m. Physical Exam  Constitutional: She is oriented to person, place, and time. She appears well-developed and well-nourished. No distress.  HENT:  Head: Normocephalic.  Mouth/Throat: Oropharynx is clear and moist. No oropharyngeal exudate.  Eyes: Conjunctivae and EOM are normal. Pupils are equal,  round, and reactive to light. Right eye exhibits no discharge. Left eye exhibits no discharge. No scleral icterus.  Neck: Normal range of motion. No JVD present. No thyromegaly present.  Cardiovascular: Normal rate, regular rhythm, normal heart sounds and intact distal pulses.  Exam reveals no gallop and no friction rub.   No murmur heard. Pulmonary/Chest: Effort normal and breath sounds normal.  Abdominal: Soft. Bowel sounds are normal. She exhibits no distension. There is no tenderness. There is no rebound and no guarding.  Genitourinary:  Genitourinary Comments: Continent.   Musculoskeletal: She exhibits no edema, tenderness or deformity.  unsteady gait   Lymphadenopathy:    She has no cervical adenopathy.  Neurological: She is oriented to person, place, and time.  Intermittent forgetfulness  Skin: Skin is warm and dry. No rash noted. No erythema. No pallor.  Overgrown toenails  Psychiatric: She has a normal mood and affect.    Labs reviewed:  Recent Labs  11/16/15 0535 11/17/15 0400 11/18/15 0439  NA 137 138 137  K 3.8 4.0 4.2  CL 107 107  104  CO2 28 28 26   GLUCOSE 121* 126* 114*  BUN 10 10 14   CREATININE 0.65 0.83 0.74  CALCIUM 7.7* 8.0* 8.3*    Recent Labs  11/15/15 1001 11/16/15 0535 11/17/15 0400 11/18/15 0439 12/11/15  WBC 11.5* 6.3 8.7 8.2 6.8  NEUTROABS 10.5*  --   --   --   --   HGB 15.5 10.8* 10.5* 9.8* 11.4*  HCT 44.4 30.3* 30.7* 27.8* 36  MCV 93.8 95.3 97.1 95.4  --   PLT 216 163 182 193 407*   Lab Results  Component Value Date   TSH 0.39 (A) 04/03/2016   Assessment/Plan Overgrown Toenails Refer to podiatrist for evaluation.   Hypothyroidism  Continue on levothyroxine 125 mcg Tablet. Monitor TSH level.   Constipation  Current regimen effective. Continue to encourage fluid intake.    Family/ staff Communication: Reviewed plan of care with patient and facility Nurse supervisor.   Labs/tests ordered:  None

## 2016-06-12 ENCOUNTER — Encounter: Payer: Self-pay | Admitting: Family

## 2016-06-12 ENCOUNTER — Non-Acute Institutional Stay (SKILLED_NURSING_FACILITY): Payer: Medicare Other | Admitting: Family

## 2016-06-12 DIAGNOSIS — E539 Vitamin B deficiency, unspecified: Secondary | ICD-10-CM | POA: Diagnosis not present

## 2016-06-12 DIAGNOSIS — K219 Gastro-esophageal reflux disease without esophagitis: Secondary | ICD-10-CM

## 2016-06-12 DIAGNOSIS — E559 Vitamin D deficiency, unspecified: Secondary | ICD-10-CM

## 2016-06-12 DIAGNOSIS — E039 Hypothyroidism, unspecified: Secondary | ICD-10-CM

## 2016-06-12 NOTE — Progress Notes (Signed)
Location:  [facility] Room Number: [id number] Place of Service:  SNF (31)  Provider: Richarda Blade, FNP-C   Oneal Grout, MD  Patient Care Team: Oneal Grout, MD as PCP - General (Internal Medicine)  Extended Emergency Contact Information Primary Emergency Contact: Everrett Coombe [place] Home Phone: [phone] Relation: Daughter  Code Status:  DNR Goals of care: Advanced Directive information Advanced Directives 06/12/2016  Does Patient Have a Medical Advance Directive? Yes  Type of Advance Directive Out of facility DNR (pink MOST or yellow form)  Does patient want to make changes to medical advance directive? No - Patient declined  Copy of Healthcare Power of Attorney in Chart? -  Would patient like information on creating a medical advance directive? -  Pre-existing out of facility DNR order (yellow form or pink MOST form) -     Chief Complaint  Patient presents with  . Medical Management of Chronic Issues    Routine Visit    HPI:  Pt is a 83 y.o. female seen today at [facility]  for medical management of chronic diseases. She has a medical history of Dementia, Hypothyroidism, Depression,GERD among other conditions. She seen in her room today. She denies any acute issues this visit.No recent illnesses or weight changes. No skin breakdown.Facility Nurse reports no new concerns.       Past Medical History:  Diagnosis Date  . Constipation   . Dementia   . Depression   . GERD (gastroesophageal reflux disease)   . Thyroid disease    Past Surgical History:  Procedure Laterality Date  . ABDOMINAL HYSTERECTOMY  1980   total  . CHOLECYSTECTOMY  1990  . INTRAMEDULLARY (IM) NAIL INTERTROCHANTERIC Right 11/15/2015   Procedure: INTRAMEDULLARY (IM) NAIL INTERTROCHANTRIC;  Surgeon: Christena Flake, MD;  Location: [facility] ORS;  Service: Orthopedics;  Laterality: Right;    Allergies  Allergen Reactions  . Erythromycin      Patient unsure of reaction    Allergies as of 06/12/2016      Reactions   Erythromycin    Patient unsure of reaction      Medication List       Accurate as of 06/12/16  4:11 PM. Always use your most recent med list.          cholecalciferol 1000 units tablet Commonly known as:  VITAMIN D Take 1 tablet (1,000 Units total) by mouth daily.   levothyroxine 125 MCG tablet Commonly known as:  SYNTHROID, LEVOTHROID Take 1 tablet (125 mcg total) by mouth daily before breakfast.   magnesium hydroxide 400 MG/5ML suspension Commonly known as:  MILK OF MAGNESIA Take 30 mLs by mouth daily as needed for mild constipation.   Melatonin 3 MG Tabs Take 1 tablet (3 mg total) by mouth at bedtime.   omeprazole 20 MG capsule Commonly known as:  PRILOSEC Take 20 mg by mouth daily.   OXYGEN Inhale 2 L into the lungs as needed. As needed to maintain o2 saturation above 90%   PARoxetine 20 MG tablet Commonly known as:  PAXIL Take 20 mg by mouth daily.   POLYETHYLENE GLYCOL 3350 PO Take 17 g by mouth daily.   SENNA S PO Take 2 tablets by mouth daily as needed.   traZODone 150 MG tablet Commonly known as:  DESYREL Take 150 mg by mouth at bedtime.   vitamin B-12 1000 MCG tablet Commonly known as:  CYANOCOBALAMIN Take 1 tablet (1,000 mcg total) by  mouth daily.       Review of Systems  Constitutional: Negative for activity change, appetite change, chills and fever.  HENT: Negative for congestion, rhinorrhea, sinus pressure, sneezing and sore throat.   Eyes: Negative.   Respiratory: Negative for cough, chest tightness, shortness of breath and wheezing.   Cardiovascular: Negative for chest pain, palpitations and leg swelling.  Gastrointestinal: Negative for abdominal distention, abdominal pain, constipation, diarrhea, nausea and vomiting.  Endocrine: Negative for cold intolerance, heat intolerance, polydipsia, polyphagia and polyuria.  Genitourinary: Negative for dysuria, frequency  and urgency.  Musculoskeletal: Positive for gait problem.  Skin: Negative for color change, pallor and rash.  Neurological: Negative for dizziness, seizures, syncope, light-headedness and headaches.  Hematological: Does not bruise/bleed easily.  Psychiatric/Behavioral: Negative for agitation, confusion, hallucinations and sleep disturbance. The patient is not nervous/anxious.     Immunization History  Administered Date(s) Administered  . Influenza, High Dose Seasonal PF 02/20/2015  . Influenza-Unspecified 05/07/2016  . Pneumococcal Conjugate-13 02/15/2014  . Pneumococcal Polysaccharide-23 02/20/2015   Pertinent  Health Maintenance Due  Topic Date Due  . DEXA SCAN  07/27/1993  . INFLUENZA VACCINE  Completed  . PNA vac Low Risk Adult  Completed   Fall Risk  02/20/2015  Falls in the past year? No    Vitals:   06/12/16 1519  BP: (!) 153/76  Pulse: 78  Resp: 16  Temp: 97 F (36.1 C)  SpO2: 98%  Weight: 156 lb (70.8 kg)  Height: 5\' 5"  (1.651 m)   Body mass index is 25.96 kg/m. Physical Exam  Constitutional: She is oriented to person, place, and time. She appears well-developed and well-nourished. No distress.  HENT:  Head: Normocephalic.  Mouth/Throat: Oropharynx is clear and moist. No oropharyngeal exudate.  HOH   Eyes: Conjunctivae and EOM are normal. Pupils are equal, round, and reactive to light. Right eye exhibits no discharge. Left eye exhibits no discharge. No scleral icterus.  Neck: Normal range of motion. No JVD present. No thyromegaly present.  Cardiovascular: Normal rate, regular rhythm, normal heart sounds and intact distal pulses.  Exam reveals no gallop and no friction rub.   No murmur heard. Pulmonary/Chest: Effort normal and breath sounds normal.  Abdominal: Soft. Bowel sounds are normal. She exhibits no distension. There is no tenderness. There is no rebound and no guarding.  Genitourinary:  Genitourinary Comments: Continent.   Musculoskeletal: She  exhibits no edema, tenderness or deformity.  unsteady gait uses FWW  Lymphadenopathy:    She has no cervical adenopathy.  Neurological: She is oriented to person, place, and time.  Forgetful at times.   Skin: Skin is warm and dry. No rash noted. No erythema. No pallor.  Skin intact.  Psychiatric: She has a normal mood and affect.    Labs reviewed:  Recent Labs  11/16/15 0535 11/17/15 0400 11/18/15 0439 05/13/16  NA 137 138 137 142  K 3.8 4.0 4.2 4.3  CL 107 107 104  --   CO2 28 28 26   --   GLUCOSE 121* 126* 114*  --   BUN 10 10 14 9   CREATININE 0.65 0.83 0.74 0.8  CALCIUM 7.7* 8.0* 8.3*  --     Recent Labs  11/15/15 1001 11/16/15 0535 11/17/15 0400 11/18/15 0439 12/11/15 05/13/16 05/13/16 1548  WBC 11.5* 6.3 8.7 8.2 6.8  --  7.4  NEUTROABS 10.5*  --   --   --   --   --   --   HGB 15.5 10.8* 10.5* 9.8*  11.4* 13.2  --   HCT 44.4 30.3* 30.7* 27.8* 36 41  --   MCV 93.8 95.3 97.1 95.4  --   --   --   PLT 216 163 182 193 407* 275  --    Lab Results  Component Value Date   TSH 0.39 (A) 04/03/2016   Assessment/Plan  Hypothyroidism  Continue on levothyroxine 125 mcg Tablet. Monitor TSH level.   GERD Stable. Continue on omeprazole. Monitor mg level with next labs.    Vitamin B12 deficiency Continue on vitamin B12 supplements.monitor vit b12 levels.    Vitamin D deficiency  Continue on vit D supplements. Monitor vit D  levels.   Family/ staff Communication: Reviewed plan of care with patient and facility Nurse supervisor.   Labs/tests ordered:  None

## 2016-07-07 ENCOUNTER — Encounter: Payer: Self-pay | Admitting: Family

## 2016-07-07 ENCOUNTER — Non-Acute Institutional Stay (SKILLED_NURSING_FACILITY): Payer: Medicare Other | Admitting: Family

## 2016-07-07 DIAGNOSIS — E039 Hypothyroidism, unspecified: Secondary | ICD-10-CM | POA: Diagnosis not present

## 2016-07-07 DIAGNOSIS — R2681 Unsteadiness on feet: Secondary | ICD-10-CM

## 2016-07-07 DIAGNOSIS — Z043 Encounter for examination and observation following other accident: Secondary | ICD-10-CM

## 2016-07-07 DIAGNOSIS — R296 Repeated falls: Secondary | ICD-10-CM | POA: Diagnosis not present

## 2016-07-07 NOTE — Progress Notes (Signed)
Location:  [facility] Room Number: 208 Place of Service:  SNF (31)  Provider: Richarda Bladeinah Ngetich, FNP-C   Oneal GroutPANDEY, MAHIMA, MD  Patient Care Team: Oneal GroutMahima Pandey, MD as PCP - General (Internal Medicine)  Extended Emergency Contact Information Primary Emergency Contact: Everrett CoombeQualls,Kathy  [place] Home Phone: [phone] Relation: Daughter  Code Status:  DNR Goals of care: Advanced Directive information Advanced Directives 07/07/2016  Does Patient Have a Medical Advance Directive? Yes  Type of Advance Directive Out of facility DNR (pink MOST or yellow form)  Does patient want to make changes to medical advance directive? No - Patient declined  Copy of Healthcare Power of Attorney in Chart? -  Would patient like information on creating a medical advance directive? -  Pre-existing out of facility DNR order (yellow form or pink MOST form) -     Chief Complaint  Patient presents with  . Acute Visit    Fall    HPI:  Pt is a 83 y.o. female seen today at [facility]  for medical management of chronic diseases. She has a medical history of Dementia, Hypothyroidism,Depression,GERD among other conditions.She seen in her room today.Facility Nurse reports patient was found lying on her back with walker on top of her in the bathroom.Nurse reports patient complained of dizziness.she was assisted by facility staff back in bed.She does not remember sustaining fall " who said I fell".She denies any acute issues this visit though history limited due to cognitive impairment.she has had no changes in her neuro checks.       Past Medical History:  Diagnosis Date  . Constipation   . Dementia   . Depression   . GERD (gastroesophageal reflux disease)   . Thyroid disease    Past Surgical History:  Procedure Laterality Date  . ABDOMINAL HYSTERECTOMY  1980   total  . CHOLECYSTECTOMY  1990  . INTRAMEDULLARY (IM) NAIL INTERTROCHANTERIC Right  11/15/2015   Procedure: INTRAMEDULLARY (IM) NAIL INTERTROCHANTRIC;  Surgeon: Christena FlakeJohn J Poggi, MD;  Location: [facility] ORS;  Service: Orthopedics;  Laterality: Right;    Allergies  Allergen Reactions  . Erythromycin     Patient unsure of reaction    Allergies as of 07/07/2016      Reactions   Erythromycin    Patient unsure of reaction      Medication List       Accurate as of 07/07/16  3:48 PM. Always use your most recent med list.          cholecalciferol 1000 units tablet Commonly known as:  VITAMIN D Take 1 tablet (1,000 Units total) by mouth daily.   levothyroxine 125 MCG tablet Commonly known as:  SYNTHROID, LEVOTHROID Take 1 tablet (125 mcg total) by mouth daily before breakfast.   magnesium hydroxide 400 MG/5ML suspension Commonly known as:  MILK OF MAGNESIA Take 30 mLs by mouth daily as needed for mild constipation.   Melatonin 3 MG Tabs Take 1 tablet (3 mg total) by mouth at bedtime.   omeprazole 20 MG capsule Commonly known as:  PRILOSEC Take 20 mg by mouth daily.   OXYGEN Inhale 2 L into the lungs as needed. As needed to maintain o2 saturation above 90%   PARoxetine 20 MG tablet Commonly known as:  PAXIL Take 20 mg by mouth daily.   POLYETHYLENE GLYCOL 3350 PO Take 17 g by mouth daily.   SENNA S PO Take 2 tablets by mouth daily as needed.  traZODone 150 MG tablet Commonly known as:  DESYREL Take 150 mg by mouth at bedtime.   vitamin B-12 1000 MCG tablet Commonly known as:  CYANOCOBALAMIN Take 1 tablet (1,000 mcg total) by mouth daily.       Review of Systems  Constitutional: Negative for activity change, appetite change, chills and fever.  HENT: Negative for congestion, rhinorrhea, sinus pressure, sneezing and sore throat.   Eyes: Negative.   Respiratory: Negative for cough, chest tightness, shortness of breath and wheezing.   Cardiovascular: Negative for chest pain, palpitations and leg swelling.  Gastrointestinal: Negative for abdominal  distention, abdominal pain, constipation, diarrhea, nausea and vomiting.  Endocrine: Negative for cold intolerance, heat intolerance, polydipsia, polyphagia and polyuria.  Genitourinary: Negative for dysuria, frequency and urgency.  Musculoskeletal: Positive for gait problem.  Skin: Negative for color change, pallor and rash.  Neurological: Negative for dizziness, seizures, syncope, light-headedness and headaches.  Hematological: Does not bruise/bleed easily.  Psychiatric/Behavioral: Negative for agitation, confusion, hallucinations and sleep disturbance. The patient is not nervous/anxious.     Immunization History  Administered Date(s) Administered  . Influenza, High Dose Seasonal PF 02/20/2015  . Influenza-Unspecified 05/07/2016  . Pneumococcal Conjugate-13 02/15/2014  . Pneumococcal Polysaccharide-23 02/20/2015   Pertinent  Health Maintenance Due  Topic Date Due  . DEXA SCAN  07/27/1993  . INFLUENZA VACCINE  Completed  . PNA vac Low Risk Adult  Completed   Fall Risk  02/20/2015  Falls in the past year? No    Vitals:   07/07/16 1432  BP: (!) 147/73  Pulse: 81  Resp: (!) 22  Temp: 98.2 F (36.8 C)  TempSrc: Oral   There is no height or weight on file to calculate BMI. Physical Exam  Constitutional: She is oriented to person, place, and time. She appears well-developed and well-nourished. No distress.  HENT:  Head: Normocephalic.  Mouth/Throat: Oropharynx is clear and moist. No oropharyngeal exudate.  HOH   Eyes: Conjunctivae and EOM are normal. Pupils are equal, round, and reactive to light. Right eye exhibits no discharge. Left eye exhibits no discharge. No scleral icterus.  Neck: Normal range of motion. No JVD present. No thyromegaly present.  Cardiovascular: Normal rate, regular rhythm, normal heart sounds and intact distal pulses.  Exam reveals no gallop and no friction rub.   No murmur heard. Pulmonary/Chest: Effort normal and breath sounds normal.  Abdominal:  Soft. Bowel sounds are normal. She exhibits no distension. There is no tenderness. There is no rebound and no guarding.  Genitourinary:  Genitourinary Comments: Continent.   Musculoskeletal: She exhibits no edema, tenderness or deformity.  Moves x 4 extremities without any difficulties. unsteady gait  Lymphadenopathy:    She has no cervical adenopathy.  Neurological: She is oriented to person, place, and time.  Forgetful at baseline.   Skin: Skin is warm and dry. No rash noted. No erythema. No pallor.  Skin intact.  Psychiatric: She has a normal mood and affect.    Labs reviewed:  Recent Labs  11/16/15 0535 11/17/15 0400 11/18/15 0439 05/13/16  NA 137 138 137 142  K 3.8 4.0 4.2 4.3  CL 107 107 104  --   CO2 28 28 26   --   GLUCOSE 121* 126* 114*  --   BUN 10 10 14 9   CREATININE 0.65 0.83 0.74 0.8  CALCIUM 7.7* 8.0* 8.3*  --     Recent Labs  11/15/15 1001 11/16/15 0535 11/17/15 0400 11/18/15 0439 12/11/15 05/13/16 05/13/16 1548  WBC 11.5* 6.3  8.7 8.2 6.8  --  7.4  NEUTROABS 10.5*  --   --   --   --   --   --   HGB 15.5 10.8* 10.5* 9.8* 11.4* 13.2  --   HCT 44.4 30.3* 30.7* 27.8* 36 41  --   MCV 93.8 95.3 97.1 95.4  --   --   --   PLT 216 163 182 193 407* 275  --    Lab Results  Component Value Date   TSH 0.39 (A) 04/03/2016   Assessment/Plan  Unsteady gait  Ambulates with walker.Status post fall. Will have her working with Restorative therapy for gait stability and muscle strengthening. Fall and safety precautions.   Hypothyroidism   continue on levothyroxine. Check TSH level 07/08/2016   Encounter for post Fall Afebrile.unwitnessed fall episode overnight. No acute injuries.exam findings negative.confused at baseline. Will wean off Trazodone 150 mg Tablet. Start Trazodone 100 mg Tablet one by mouth at bedtime X 4 weeks then start 50 mg Tablet at bedtime.Will obtain CBC/diff, CMP 07/08/2016.continue to monitor.     Family/ staff Communication: Reviewed plan of  care with patient and facility Nurse supervisor.   Labs/tests ordered:  CBC/diff, CMP and TSH level  07/08/2016.

## 2016-07-08 LAB — BASIC METABOLIC PANEL
BUN: 4 mg/dL (ref 4–21)
CREATININE: 0.9 mg/dL (ref 0.5–1.1)
Glucose: 133 mg/dL
POTASSIUM: 4.1 mmol/L (ref 3.4–5.3)
Sodium: 142 mmol/L (ref 137–147)

## 2016-07-08 LAB — HEPATIC FUNCTION PANEL
ALT: 9 U/L (ref 7–35)
AST: 13 U/L (ref 13–35)
Alkaline Phosphatase: 81 U/L (ref 25–125)
Bilirubin, Total: 0.5 mg/dL

## 2016-07-08 LAB — TSH: TSH: 0.25 u[IU]/mL — AB (ref 0.41–5.90)

## 2016-07-08 LAB — CBC AND DIFFERENTIAL
HEMATOCRIT: 40 % (ref 36–46)
HEMOGLOBIN: 12.9 g/dL (ref 12.0–16.0)
PLATELETS: 277 10*3/uL (ref 150–399)
WBC: 8.7 10*3/mL

## 2016-07-10 ENCOUNTER — Encounter: Payer: Self-pay | Admitting: Internal Medicine

## 2016-07-10 ENCOUNTER — Non-Acute Institutional Stay (SKILLED_NURSING_FACILITY): Payer: Medicare Other | Admitting: Internal Medicine

## 2016-07-10 DIAGNOSIS — E0580 Other thyrotoxicosis without thyrotoxic crisis or storm: Secondary | ICD-10-CM

## 2016-07-10 DIAGNOSIS — R296 Repeated falls: Secondary | ICD-10-CM

## 2016-07-10 DIAGNOSIS — E039 Hypothyroidism, unspecified: Secondary | ICD-10-CM

## 2016-07-10 NOTE — Progress Notes (Signed)
Patient ID: Virginia Osborne, female   DOB: 1926/12/23, 83 y.o.   MRN: [id number]     LOCATION: Facility: [facility]    PCP: Oneal GroutPANDEY, Ardell Aaronson, MD   Code Status: DNR  Goals of care: Advanced Directive information Advanced Directives 07/07/2016  Does Patient Have a Medical Advance Directive? Yes  Type of Advance Directive Out of facility DNR (pink MOST or yellow form)  Does patient want to make changes to medical advance directive? No - Patient declined  Copy of Healthcare Power of Attorney in Chart? -  Would patient like information on creating a medical advance directive? -  Pre-existing out of facility DNR order (yellow form or pink MOST form) -       Extended Emergency Contact Information Primary Emergency Contact: Everrett CoombeQualls,Kathy  [place] Home Phone: [phone] Relation: Daughter   Allergies  Allergen Reactions  . Erythromycin     Patient unsure of reaction    Chief Complaint  Patient presents with  . Acute Visit    Abnormal Thyroid Lab     HPI:  Patient is a 83 y.o. female seen today for acute visit. She has low tsh level on lab review. She has history of hypothyroidism and is on levothyroxine at present. She is seen in her room. She is pleasantly confused.   Review of Systems:  Constitutional: Negative for fever, chills, diaphoresis.  HENT: Negative for headache, congestion Eyes: Negative for blurred vision, double vision Respiratory: Negative for cough, shortness of breath Cardiovascular: Negative for chest pain, palpitations Gastrointestinal: Negative for heartburn, nausea, vomiting, abdominal pain. Denies constipation and diarrhea.  Genitourinary: Negative for dysuria Musculoskeletal: Negative for back pain. Positive for fall.  Skin: Negative for itching, rash.  Neurological: Negative for dizziness.    Past Medical History:  Diagnosis Date  . Constipation   . Dementia   . Depression   . GERD  (gastroesophageal reflux disease)   . Thyroid disease    Past Surgical History:  Procedure Laterality Date  . ABDOMINAL HYSTERECTOMY  1980   total  . CHOLECYSTECTOMY  1990  . INTRAMEDULLARY (IM) NAIL INTERTROCHANTERIC Right 11/15/2015   Procedure: INTRAMEDULLARY (IM) NAIL INTERTROCHANTRIC;  Surgeon: Christena FlakeJohn J Poggi, MD;  Location: [facility] ORS;  Service: Orthopedics;  Laterality: Right;   Social History:   reports that she has never smoked. She has never used smokeless tobacco. She reports that she does not drink alcohol or use drugs.  Family History  Problem Relation Age of Onset  . Diabetes Sister     Medications: Allergies as of 07/10/2016      Reactions   Erythromycin    Patient unsure of reaction      Medication List       Accurate as of 07/10/16 12:34 PM. Always use your most recent med list.          cholecalciferol 1000 units tablet Commonly known as:  VITAMIN D Take 1 tablet (1,000 Units total) by mouth daily.   levothyroxine 125 MCG tablet Commonly known as:  SYNTHROID, LEVOTHROID Take 1 tablet (125 mcg total) by mouth daily before breakfast.   magnesium hydroxide 400 MG/5ML suspension Commonly known as:  MILK OF MAGNESIA Take 30 mLs by mouth daily as needed for mild constipation.   Melatonin 3 MG Tabs Take 1 tablet (3 mg total) by mouth at bedtime.   omeprazole 20 MG capsule Commonly known as:  PRILOSEC Take 20 mg by mouth daily.   OXYGEN Inhale 2 L into  the lungs as needed. As needed to maintain o2 saturation above 90%   PARoxetine 20 MG tablet Commonly known as:  PAXIL Take 20 mg by mouth daily.   POLYETHYLENE GLYCOL 3350 PO Take 17 g by mouth daily.   SENNA S PO Take 2 tablets by mouth daily as needed.   traZODone 150 MG tablet Commonly known as:  DESYREL Take 150 mg by mouth at bedtime.   vitamin B-12 1000 MCG tablet Commonly known as:  CYANOCOBALAMIN Take 1 tablet (1,000 mcg total) by mouth daily.       Immunizations: Immunization  History  Administered Date(s) Administered  . Influenza, High Dose Seasonal PF 02/20/2015  . Influenza-Unspecified 05/07/2016  . Pneumococcal Conjugate-13 02/15/2014  . Pneumococcal Polysaccharide-23 02/20/2015     Physical Exam:  Vitals:   07/10/16 1159  BP: (!) 148/71  Pulse: (!) 59  Resp: 20  Temp: 97.2 F (36.2 C)  TempSrc: Oral  SpO2: 95%  Weight: 156 lb 11.2 oz (71.1 kg)  Height: 5\' 5"  (1.651 m)   Body mass index is 26.08 kg/m.  General- elderly female, frail, in no acute distress Head- normocephalic, atraumatic Throat- moist mucus membrane  Eyes- PERRLA, EOMI, no pallor, no icterus Neck- no cervical lymphadenopathy Cardiovascular- normal s1,s2, no murmur Respiratory- bilateral clear to auscultation, no wheeze, no rhonchi, no crackles, no use of accessory muscles Abdomen- bowel sounds present, soft, non tender Musculoskeletal- able to move all 4 extremities, on wheelchair, no leg edema Neurological- alert and oriented to self Skin- warm and dry Psychiatry- normal mood and affect    Labs reviewed: Basic Metabolic Panel:  Recent Labs  16/10/96 0535 11/17/15 0400 11/18/15 0439 05/13/16 07/08/16  NA 137 138 137 142 142  K 3.8 4.0 4.2 4.3 4.1  CL 107 107 104  --   --   CO2 28 28 26   --   --   GLUCOSE 121* 126* 114*  --   --   BUN 10 10 14 9 4   CREATININE 0.65 0.83 0.74 0.8 0.9  CALCIUM 7.7* 8.0* 8.3*  --   --    CBC:  Recent Labs  11/15/15 1001 11/16/15 0535 11/17/15 0400 11/18/15 0439 12/11/15 05/13/16 05/13/16 1548 07/08/16  WBC 11.5* 6.3 8.7 8.2 6.8  --  7.4 8.7  NEUTROABS 10.5*  --   --   --   --   --   --   --   HGB 15.5 10.8* 10.5* 9.8* 11.4* 13.2  --  12.9  HCT 44.4 30.3* 30.7* 27.8* 36 41  --  40  MCV 93.8 95.3 97.1 95.4  --   --   --   --   PLT 216 163 182 193 407* 275  --  277   CBG:  Recent Labs  11/15/15 1510  GLUCAP 122*    Lab Results  Component Value Date   TSH 0.25 (A) 07/08/2016    Assessment/Plan  Iatrogenic  hyperthyroidism Patient denies any symptom of hyperthyroidism but on lab review has low TSH. Currently on levothyroxine 125 mcg daily. Decrease this to 100 mcg daily for now and check tsh in 4 weeks. Monitor for symptom.  Frequent fall Has Unsteady gait with recent history of right hip fracture, b12 def and deconditioning. She also has dementia. Reviewed her med list. Will have her work with restorative therapy to help restore her strength and gait. Fall precautions to be taken. Agree with titration of trazodone to help reduce fall risk. Avoid sedative/ hypnotic medication  where possible.   Acquired hypothyroidism Reviewed tsh, overcorrected. Decrease dose of levothyroxine and monitor tsh and clinical symptom.     Labs/tests ordered: TSH 4 weeks  Family/ staff Communication: reviewed care plan with patient and nursing supervisor   I have spent greater than 35 minutes for this encounter which includes reviewing medications and lab work, imaging, consults, addressing above mentioned concerns, reviewing care plan with patient, answering patient's concerns.       Oneal Grout, MD [facility] [address] [place], [place] [address] Cell Phone (Monday-Friday 8 am - 5 pm): [phone] On Call: [phone] and follow prompts after 5 pm and on weekends Office Phone: [phone] Office Fax: [phone]

## 2016-07-21 ENCOUNTER — Encounter: Payer: Self-pay | Admitting: Internal Medicine

## 2016-07-21 ENCOUNTER — Non-Acute Institutional Stay (SKILLED_NURSING_FACILITY): Payer: Medicare Other | Admitting: Internal Medicine

## 2016-07-21 DIAGNOSIS — E039 Hypothyroidism, unspecified: Secondary | ICD-10-CM | POA: Diagnosis not present

## 2016-07-21 DIAGNOSIS — F5101 Primary insomnia: Secondary | ICD-10-CM | POA: Diagnosis not present

## 2016-07-21 DIAGNOSIS — K5909 Other constipation: Secondary | ICD-10-CM

## 2016-07-21 DIAGNOSIS — K219 Gastro-esophageal reflux disease without esophagitis: Secondary | ICD-10-CM | POA: Diagnosis not present

## 2016-07-21 DIAGNOSIS — E559 Vitamin D deficiency, unspecified: Secondary | ICD-10-CM | POA: Diagnosis not present

## 2016-07-21 NOTE — Progress Notes (Signed)
Patient ID: Virginia Osborne, female   DOB: 01-29-1927, 83 y.o.   MRN: [id number]     LOCATION: Facility: [facility]    PCP: Oneal Grout, MD   Code Status: DNR  Goals of care: Advanced Directive information Advanced Directives 07/07/2016  Does Patient Have a Medical Advance Directive? Yes  Type of Advance Directive Out of facility DNR (pink MOST or yellow form)  Does patient want to make changes to medical advance directive? No - Patient declined  Copy of Healthcare Power of Attorney in Chart? -  Would patient like information on creating a medical advance directive? -  Pre-existing out of facility DNR order (yellow form or pink MOST form) -       Extended Emergency Contact Information Primary Emergency Contact: Everrett Coombe [place] Home Phone: [phone] Relation: Daughter   Allergies  Allergen Reactions  . Erythromycin     Patient unsure of reaction    Chief Complaint  Patient presents with  . Medical Management of Chronic Issues    Routine Visit      HPI:  Patient is a 83 y.o. female seen today for routine visit. She has been at her baseline. She denies any concern this visit. Per nursing, she had a mechanical fall about 2 weeks back with no apparent injury and is currently working with therapy team. She is out of bed daily and wheels herself around on a wheelchair. No pressure ulcer reported. She feeds herself.   Review of Systems:  Constitutional: Negative for fever, chills. HENT: Negative for headache, congestion, nasal discharge. Eyes: Negative for double vision and discharge.  Respiratory: Negative for cough, shortness of breath and wheezing.   Cardiovascular: Negative for chest pain, palpitations, leg swelling.  Gastrointestinal: Negative for heartburn, nausea, vomiting, abdominal pain. Had bowel movement yesterday Genitourinary: Negative for dysuria.  Musculoskeletal: Negative for back pain, fall,  pain. Skin: Negative for itching, rash.  Neurological: Negative for dizziness. Psychiatric/Behavioral: Negative for depression   Past Medical History:  Diagnosis Date  . Constipation   . Dementia   . Depression   . GERD (gastroesophageal reflux disease)   . Thyroid disease    Past Surgical History:  Procedure Laterality Date  . ABDOMINAL HYSTERECTOMY  1980   total  . CHOLECYSTECTOMY  1990  . INTRAMEDULLARY (IM) NAIL INTERTROCHANTERIC Right 11/15/2015   Procedure: INTRAMEDULLARY (IM) NAIL INTERTROCHANTRIC;  Surgeon: Christena Flake, MD;  Location: [facility] ORS;  Service: Orthopedics;  Laterality: Right;   Social History:   reports that she has never smoked. She has never used smokeless tobacco. She reports that she does not drink alcohol or use drugs.  Family History  Problem Relation Age of Onset  . Diabetes Sister     Medications: Allergies as of 07/21/2016      Reactions   Erythromycin    Patient unsure of reaction      Medication List       Accurate as of 07/21/16  2:48 PM. Always use your most recent med list.          cholecalciferol 1000 units tablet Commonly known as:  VITAMIN D Take 1 tablet (1,000 Units total) by mouth daily.   levothyroxine 100 MCG tablet Commonly known as:  SYNTHROID, LEVOTHROID Take 100 mcg by mouth daily before breakfast.   magnesium hydroxide 400 MG/5ML suspension Commonly known as:  MILK OF MAGNESIA Take 30 mLs by mouth daily as needed for mild constipation.   Melatonin 3 MG  Tabs Take 1 tablet (3 mg total) by mouth at bedtime.   omeprazole 20 MG capsule Commonly known as:  PRILOSEC Take 20 mg by mouth daily.   OXYGEN Inhale 2 L into the lungs as needed. As needed to maintain o2 saturation above 90%   PARoxetine 20 MG tablet Commonly known as:  PAXIL Take 20 mg by mouth daily.   POLYETHYLENE GLYCOL 3350 PO Take 17 g by mouth daily.   SENNA S PO Take 2 tablets by mouth daily as needed.   traZODone 100 MG tablet Commonly  known as:  DESYREL Take 100 mg by mouth at bedtime. Stop date 08/05/16.   traZODone 50 MG tablet Commonly known as:  DESYREL Take 50 mg by mouth at bedtime. Start on 08/06/16   vitamin B-12 1000 MCG tablet Commonly known as:  CYANOCOBALAMIN Take 1 tablet (1,000 mcg total) by mouth daily.       Immunizations: Immunization History  Administered Date(s) Administered  . Influenza, High Dose Seasonal PF 02/20/2015  . Influenza-Unspecified 05/07/2016  . Pneumococcal Conjugate-13 02/15/2014  . Pneumococcal Polysaccharide-23 02/20/2015     Physical Exam:  Vitals:   07/21/16 1437  BP: 127/77  Pulse: 86  Resp: 20  Temp: 98.4 F (36.9 C)  TempSrc: Oral  SpO2: 90%  Weight: 156 lb 6.4 oz (70.9 kg)  Height:  (1.651 m)  Body mass index is 26.03 kg/m.  General- elderly female, well built, in no acute distress Head- normocephalic, atraumatic Nose- no nasal discharge Throat- moist mucus membrane  Eyes- PERRLA, EOMI, no pallor, no icterus Neck- no cervical lymphadenopathy Cardiovascular- normal s1,s2, no murmur Respiratory- bilateral clear to auscultation, no wheeze, no rhonchi, no crackles, no use of accessory muscles Abdomen- bowel sounds present, soft, non tender Musculoskeletal- able to move all 4 extremities, on wheelchair, no leg edema Neurological- alert and oriented to person, place and time Skin- warm and dry Psychiatry- normal mood and affect    Labs reviewed: Basic Metabolic Panel:  Recent Labs  16/10/96 0535 11/17/15 0400 11/18/15 0439 05/13/16 07/08/16  NA 137 138 137 142 142  K 3.8 4.0 4.2 4.3 4.1  CL 107 107 104  --   --   CO2 --   --   GLUCOSE 121* 126* 114*  --   --   BUN CREATININE 0.65 0.83 0.74 0.8 0.9  CALCIUM 7.7* 8.0* 8.3*  --   --    CBC:  Recent Labs  11/15/15 1001 11/16/15 0535 11/17/15 0400 11/18/15 0439 12/11/15 05/13/16 05/13/16 1548 07/08/16  WBC 11.5* 6.3 8.7 8.2 6.8  --  7.4 8.7  NEUTROABS 10.5*   --   --   --   --   --   --   --   HGB 15.5 10.8* 10.5* 9.8* 11.4* 13.2  --  12.9  HCT 44.4 30.3* 30.7* 27.8* 36 41  --  40  MCV 93.8 95.3 97.1 95.4  --   --   --   --   PLT 216 163 182 193 407* 275  --  277   CBG:  Recent Labs  11/15/15 1510  GLUCAP 122*    Lab Results  Component Value Date   TSH 0.25 (A) 07/08/2016     Assessment/Plan  gerd Denies symptom. No history of GI bleed. Currently on prilosec 20 mg daily. Change this to prilosec 10 mg daily for 2 week and stop and monitor symptom.  Hypothyroidism  Given her low tsh, levothyroxine dose was recently decreased to 100 mcg daily, monitor tsh level in 4 weeks  Insomnia Currently on trazodone 100 mg daily until 08/05/16 and then 50 mg daily with melatonin. Monitor her sleep.   Chronic constipation On miralax daily and prn and senna s prn. D/c prn miralax order.   Vitamin d def Check vitamin d level. Continue vitamin d 3 1000 u daily for now.    Labs/tests ordered: vitamin d level   Family/ staff Communication: reviewed care plan with patient and nursing supervisor    Oneal Grout, MD Internal Medicine [facility] [address] [place], [place] [address] Cell Phone (Monday-Friday 8 am - 5 pm): [phone] On Call: [phone] and follow prompts after 5 pm and on weekends Office Phone: [phone] Office Fax: [phone]

## 2016-07-22 LAB — VITAMIN B12: VITAMIN B12: 236

## 2016-07-22 LAB — VITAMIN D 25 HYDROXY (VIT D DEFICIENCY, FRACTURES): Vit D, 25-Hydroxy: 19.47

## 2016-08-04 LAB — VITAMIN D 1,25 DIHYDROXY
VITAMIN D 1, 25 (OH) TOTAL: 49
Vitamin D2 1, 25 (OH)2: <8
Vitamin D3 1, 25 (OH)2: 49

## 2016-08-07 LAB — TSH: TSH: 3.76 u[IU]/mL (ref <=5.90)

## 2016-08-14 ENCOUNTER — Non-Acute Institutional Stay (SKILLED_NURSING_FACILITY): Payer: Medicare Other | Admitting: Family

## 2016-08-14 ENCOUNTER — Encounter: Payer: Self-pay | Admitting: Family

## 2016-08-14 DIAGNOSIS — E039 Hypothyroidism, unspecified: Secondary | ICD-10-CM | POA: Diagnosis not present

## 2016-08-14 DIAGNOSIS — K5901 Slow transit constipation: Secondary | ICD-10-CM

## 2016-08-14 DIAGNOSIS — F329 Major depressive disorder, single episode, unspecified: Secondary | ICD-10-CM | POA: Diagnosis not present

## 2016-08-14 DIAGNOSIS — F32A Depression, unspecified: Secondary | ICD-10-CM

## 2016-08-14 NOTE — Progress Notes (Signed)
Location:  [facility] Room Number: 208 Place of Service:  SNF ([id number])  Provider: Richarda Blade, FNP-C   Oneal Grout, MD  Patient Care Team: Oneal Grout, MD as PCP - General (Internal Medicine)  Extended Emergency Contact Information Primary Emergency Contact: Everrett Coombe [place] Home Phone: [phone] Relation: Daughter  Code Status:  DNR Goals of care: Advanced Directive information Advanced Directives 08/14/2016  Does Patient Have a Medical Advance Directive? Yes  Type of Advance Directive Out of facility DNR (pink MOST or yellow form)  Does patient want to make changes to medical advance directive? -  Copy of Healthcare Power of Attorney in Chart? -  Would patient like information on creating a medical advance directive? -  Pre-existing out of facility DNR order (yellow form or pink MOST form) Yellow form placed in chart (order not valid for inpatient use)     Chief Complaint  Patient presents with  . Medical Management of Chronic Issues    Routine visit    HPI:  Pt is a 83 y.o. female seen today at [facility]  for medical management of chronic diseases. She has a medical history of Dementia, Hypothyroidism, Depression,GERD among other conditions. She seen in her room today. She has had no recent fall episodes since prior visit. She continues to self propel on wheelchair and uses FWW for short distances. No skin breakdown. Facility Nurse reports no new concerns. No acute issues reported.     Past Medical History:  Diagnosis Date  . Constipation   . Dementia   . Depression   . GERD (gastroesophageal reflux disease)   . Thyroid disease    Past Surgical History:  Procedure Laterality Date  . ABDOMINAL HYSTERECTOMY  1980   total  . CHOLECYSTECTOMY  1990  . INTRAMEDULLARY (IM) NAIL INTERTROCHANTERIC Right 11/15/2015   Procedure: INTRAMEDULLARY (IM) NAIL INTERTROCHANTRIC;  Surgeon: Christena Flake,  MD;  Location: [facility] ORS;  Service: Orthopedics;  Laterality: Right;    Allergies  Allergen Reactions  . Erythromycin     Patient unsure of reaction    Allergies as of 08/14/2016      Reactions   Erythromycin    Patient unsure of reaction      Medication List       Accurate as of 08/14/16  4:08 PM. Always use your most recent med list.          acetaminophen 325 MG tablet Commonly known as:  TYLENOL Take 650 mg by mouth every 4 (four) hours as needed for mild pain.   cholecalciferol 1000 units tablet Commonly known as:  VITAMIN D Take 1 tablet (1,000 Units total) by mouth daily.   levothyroxine 100 MCG tablet Commonly known as:  SYNTHROID, LEVOTHROID Take 100 mcg by mouth daily before breakfast.   magnesium hydroxide 400 MG/5ML suspension Commonly known as:  MILK OF MAGNESIA Take 30 mLs by mouth daily as needed for mild constipation.   Melatonin 3 MG Tabs Take 1 tablet (3 mg total) by mouth at bedtime.   OXYGEN Inhale 2 L into the lungs as needed. As needed to maintain o2 saturation above 90%   PARoxetine 20 MG tablet Commonly known as:  PAXIL Take 20 mg by mouth daily.   POLYETHYLENE GLYCOL 3350 PO Take 17 g by mouth daily.   SENNA S PO Take 2 tablets by mouth daily as needed.   SYSTANE BALANCE 0.6 % Soln Generic drug:  Propylene  Glycol Apply 1 drop to eye 2 (two) times daily.   traZODone 50 MG tablet Commonly known as:  DESYREL Take 50 mg by mouth at bedtime. Start on 08/06/16   vitamin B-12 1000 MCG tablet Commonly known as:  CYANOCOBALAMIN Take 1 tablet (1,000 mcg total) by mouth daily.       Review of Systems  Constitutional: Negative for activity change, appetite change, chills and fever.  HENT: Negative for congestion, rhinorrhea, sinus pressure, sneezing and sore throat.   Eyes: Negative.   Respiratory: Negative for cough, chest tightness, shortness of breath and wheezing.   Cardiovascular: Negative for chest pain, palpitations and leg  swelling.  Gastrointestinal: Negative for abdominal distention, abdominal pain, constipation, diarrhea, nausea and vomiting.  Endocrine: Negative for cold intolerance, heat intolerance, polydipsia, polyphagia and polyuria.  Genitourinary: Negative for dysuria, frequency and urgency.  Musculoskeletal: Positive for gait problem.  Skin: Negative for color change, pallor and rash.  Neurological: Negative for dizziness, seizures, syncope, light-headedness and headaches.  Hematological: Does not bruise/bleed easily.  Psychiatric/Behavioral: Negative for agitation, confusion, hallucinations and sleep disturbance. The patient is not nervous/anxious.     Immunization History  Administered Date(s) Administered  . Influenza, High Dose Seasonal PF 02/20/2015  . Influenza-Unspecified 05/07/2016  . Pneumococcal Conjugate-13 02/15/2014  . Pneumococcal Polysaccharide-23 02/20/2015   Pertinent  Health Maintenance Due  Topic Date Due  . DEXA SCAN  07/27/1993  . INFLUENZA VACCINE  11/12/2016  . PNA vac Low Risk Adult  Completed   Fall Risk  02/20/2015  Falls in the past year? No    Vitals:   08/14/16 1057  BP: (!) 153/73  Pulse: 79  Resp: 18  Temp: 99.2 F (37.3 C)  SpO2: 99%  Weight: 156 lb 4 oz (70.9 kg)  Height: 5\' 5"  (1.651 m)   Body mass index is 26 kg/m. Physical Exam  Constitutional: She is oriented to person, place, and time. She appears well-developed and well-nourished. No distress.  HENT:  Head: Normocephalic.  Mouth/Throat: Oropharynx is clear and moist. No oropharyngeal exudate.  HOH   Eyes: Conjunctivae and EOM are normal. Pupils are equal, round, and reactive to light. Right eye exhibits no discharge. Left eye exhibits no discharge. No scleral icterus.  Neck: Normal range of motion. No JVD present. No thyromegaly present.  Cardiovascular: Normal rate, regular rhythm, normal heart sounds and intact distal pulses.  Exam reveals no gallop and no friction rub.   No murmur  heard. Pulmonary/Chest: Effort normal and breath sounds normal.  Abdominal: Soft. Bowel sounds are normal. She exhibits no distension. There is no tenderness. There is no rebound and no guarding.  Genitourinary:  Genitourinary Comments: Continent.   Musculoskeletal: She exhibits no edema, tenderness or deformity.  unsteady gait uses FWW  Lymphadenopathy:    She has no cervical adenopathy.  Neurological: She is oriented to person, place, and time.  Forgetful at times.   Skin: Skin is warm and dry. No rash noted. No erythema. No pallor.  Skin intact.  Psychiatric: She has a normal mood and affect.    Labs reviewed:  Recent Labs  11/16/15 0535 11/17/15 0400 11/18/15 0439 05/13/16 07/08/16  NA 137 138 137 142 142  K 3.8 4.0 4.2 4.3 4.1  CL 107 107 104  --   --   CO2 28 28 26   --   --   GLUCOSE 121* 126* 114*  --   --   BUN 10 10 14 9 4   CREATININE 0.65 0.83 0.74  0.8 0.9  CALCIUM 7.7* 8.0* 8.3*  --   --     Recent Labs  11/15/15 1001 11/16/15 0535 11/17/15 0400 11/18/15 0439 12/11/15 05/13/16 05/13/16 1548 07/08/16  WBC 11.5* 6.3 8.7 8.2 6.8  --  7.4 8.7  NEUTROABS 10.5*  --   --   --   --   --   --   --   HGB 15.5 10.8* 10.5* 9.8* 11.4* 13.2  --  12.9  HCT 44.4 30.3* 30.7* 27.8* 36 41  --  40  MCV 93.8 95.3 97.1 95.4  --   --   --   --   PLT 216 163 182 193 407* 275  --  277   Lab Results  Component Value Date   TSH 3.76 08/07/2016   Assessment/Plan  Hypothyroidism Lab Results  Component Value Date   TSH 3.76 08/07/2016  Continue on levothyroxine. Monitor TSH level.   Constipation Current medication effective. Continue to encourage hydration.     Depression Stable. Continue trazodone and Paxil. Monitor for mood changes.      Family/ staff Communication: Reviewed plan of care with patient and facility Nurse supervisor.   Labs/tests ordered:  None

## 2017-11-25 ENCOUNTER — Encounter: Payer: Self-pay | Admitting: Internal Medicine

## 2018-04-25 IMAGING — CR DG HIP (WITH PELVIS) OPERATIVE*R*
4 series · 4 of 4 positions shown · non-contrast
Comparison: None.

CLINICAL DATA: Hip fracture

EXAM:
OPERATIVE RIGHT HIP (WITH PELVIS IF PERFORMED) 4 VIEWS
TECHNIQUE: Fluoroscopic spot image(s) were submitted for interpretation
post-operatively.

[cont. (1 of 4)]
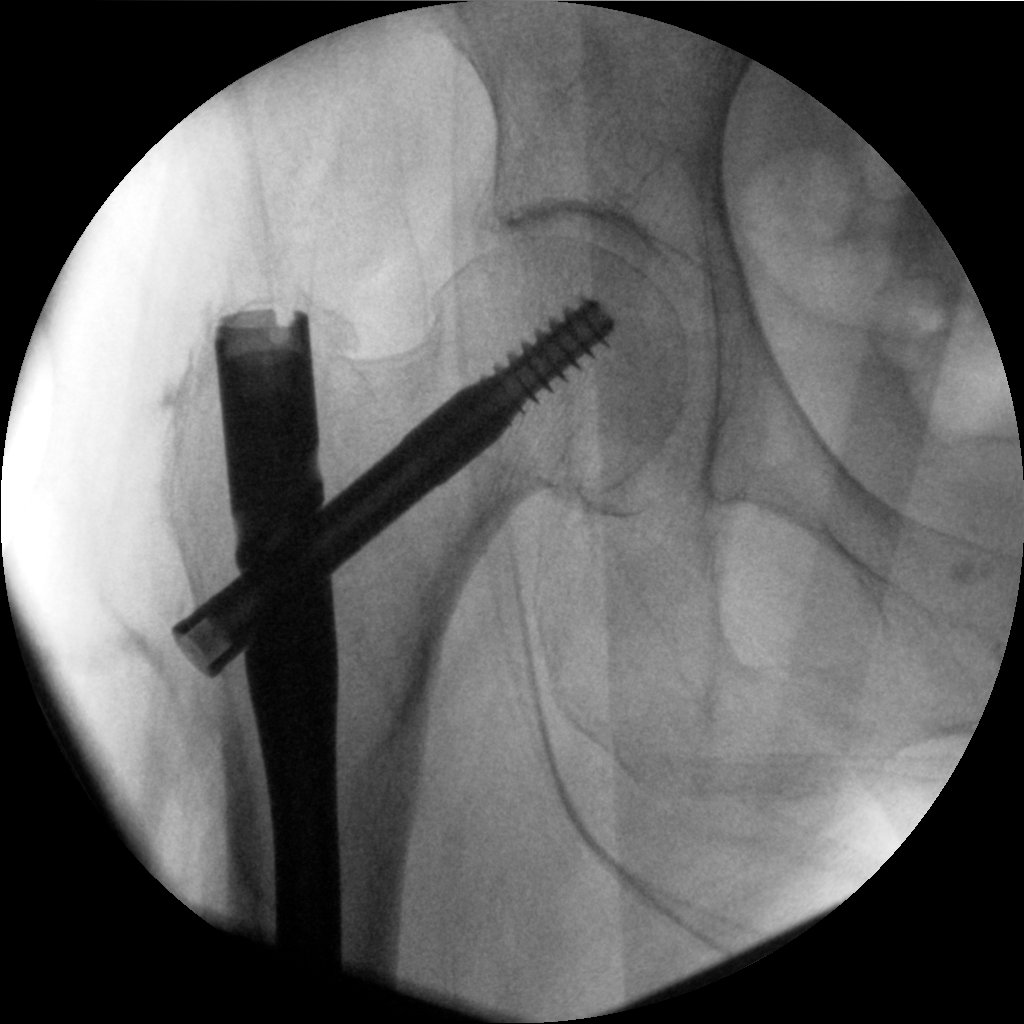

[cont. (2 of 4)]
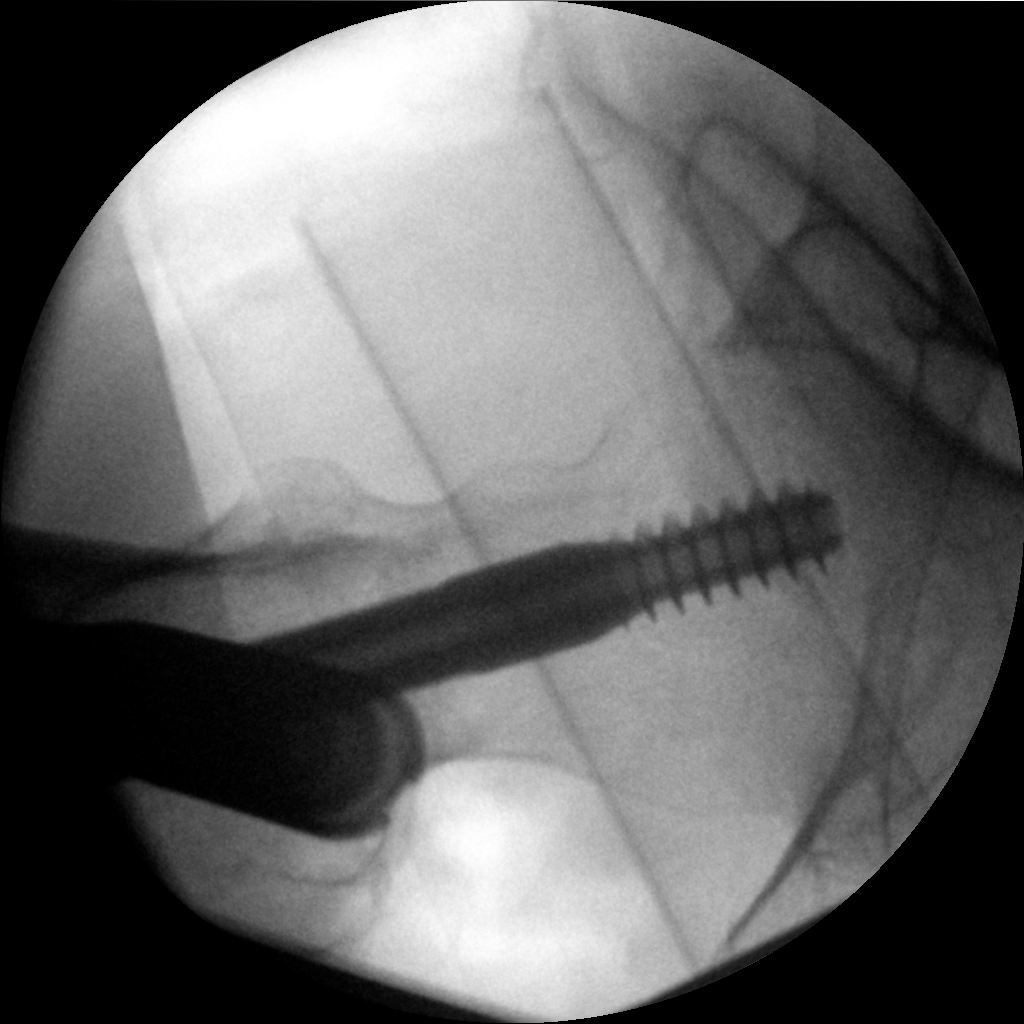

[cont. (3 of 4)]
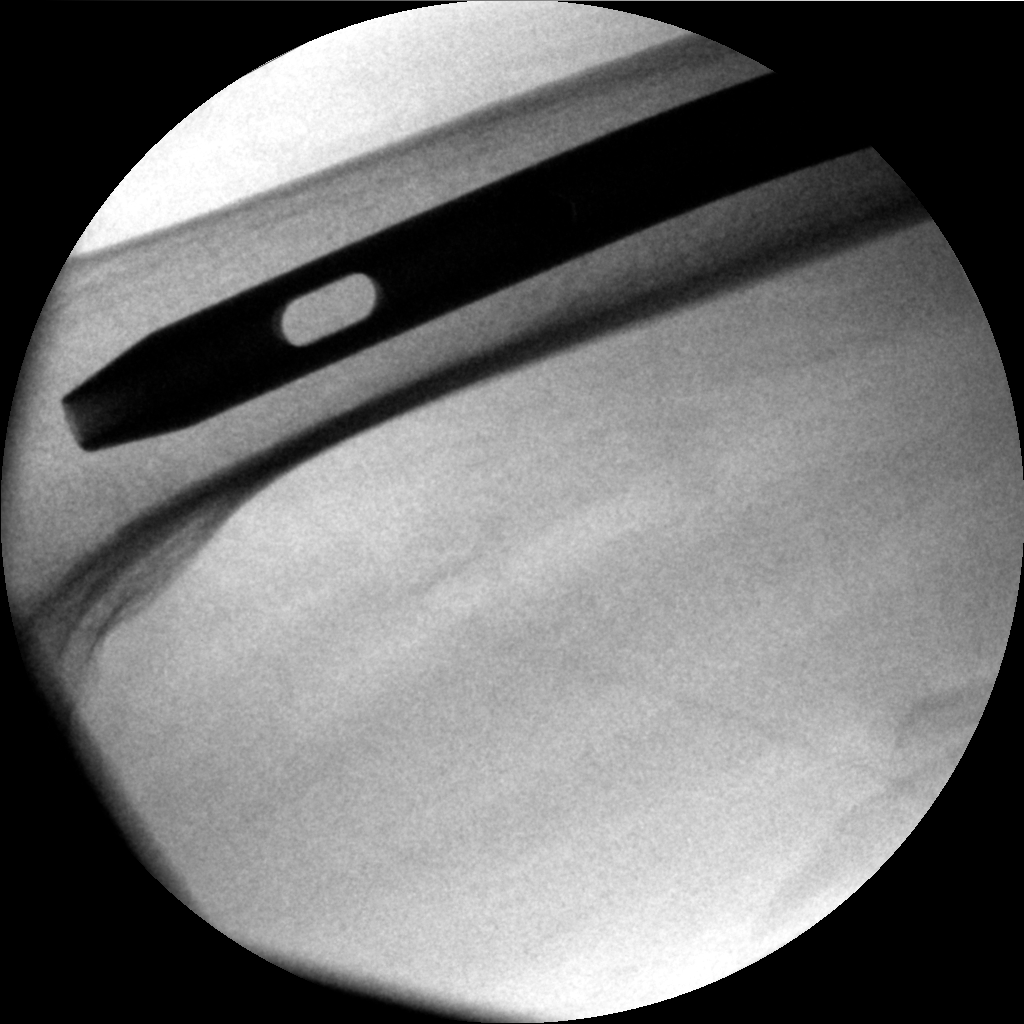

[cont. (4 of 4)]
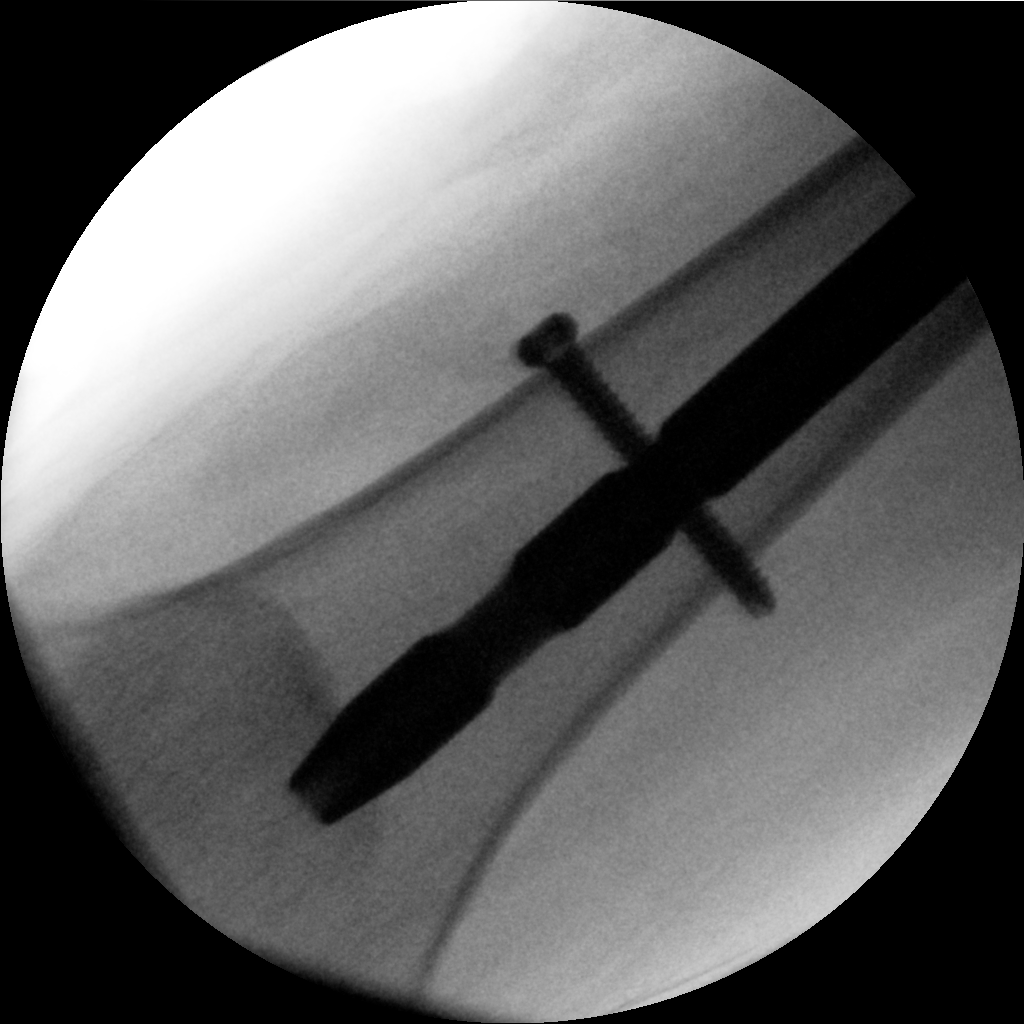

[4 of 4 positions shown; findings below may reference images not displayed]

FINDINGS: Patient is status post open reduction internal fixation of a 2 part
intertrochanteric fracture, with a compression screw and intra
medullary rod. Satisfactory position and alignment.
IMPRESSION: As above.

## 2018-04-25 IMAGING — CR DG KNEE COMPLETE 4+V*L*
1 series · 4 of 4 positions shown · non-contrast
Comparison: None.

CLINICAL DATA: Fell this morning with right-sided pain.

EXAM:
LEFT KNEE - COMPLETE 4+ VIEW

[Series 1: dg knee complete 4 views left · 0.14mm/px · 4 of 4 slices shown]
[im 1/4]
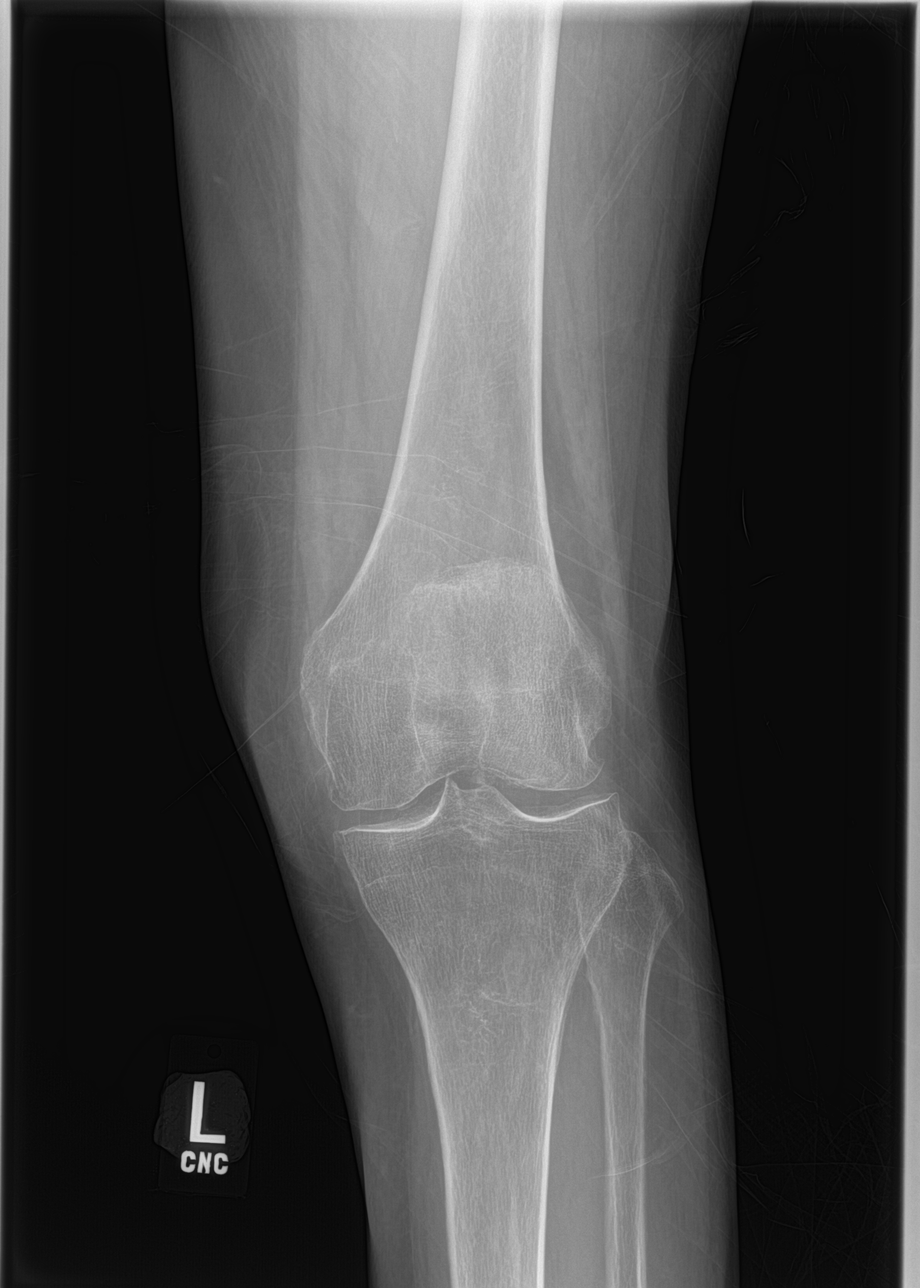
[im 2/4]
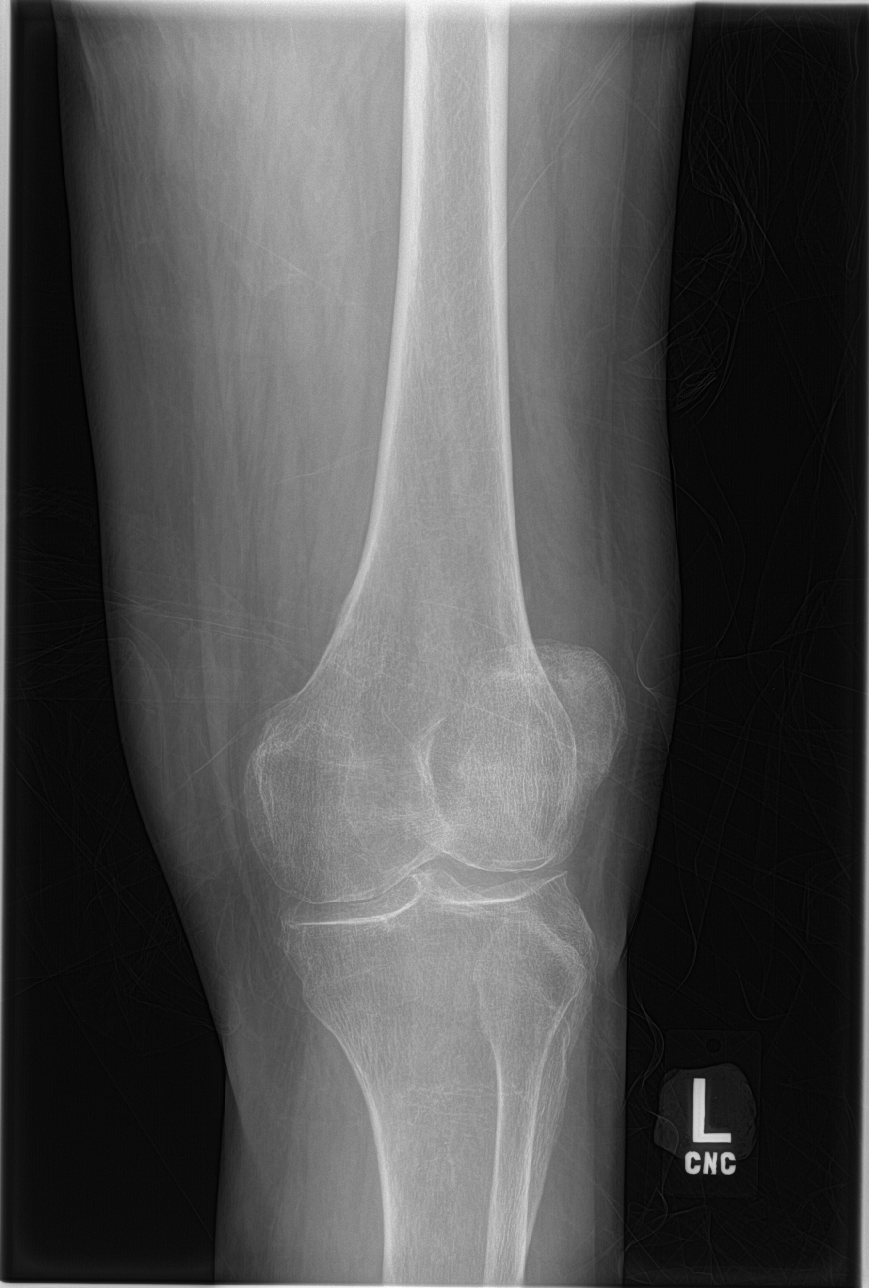
[im 3/4]
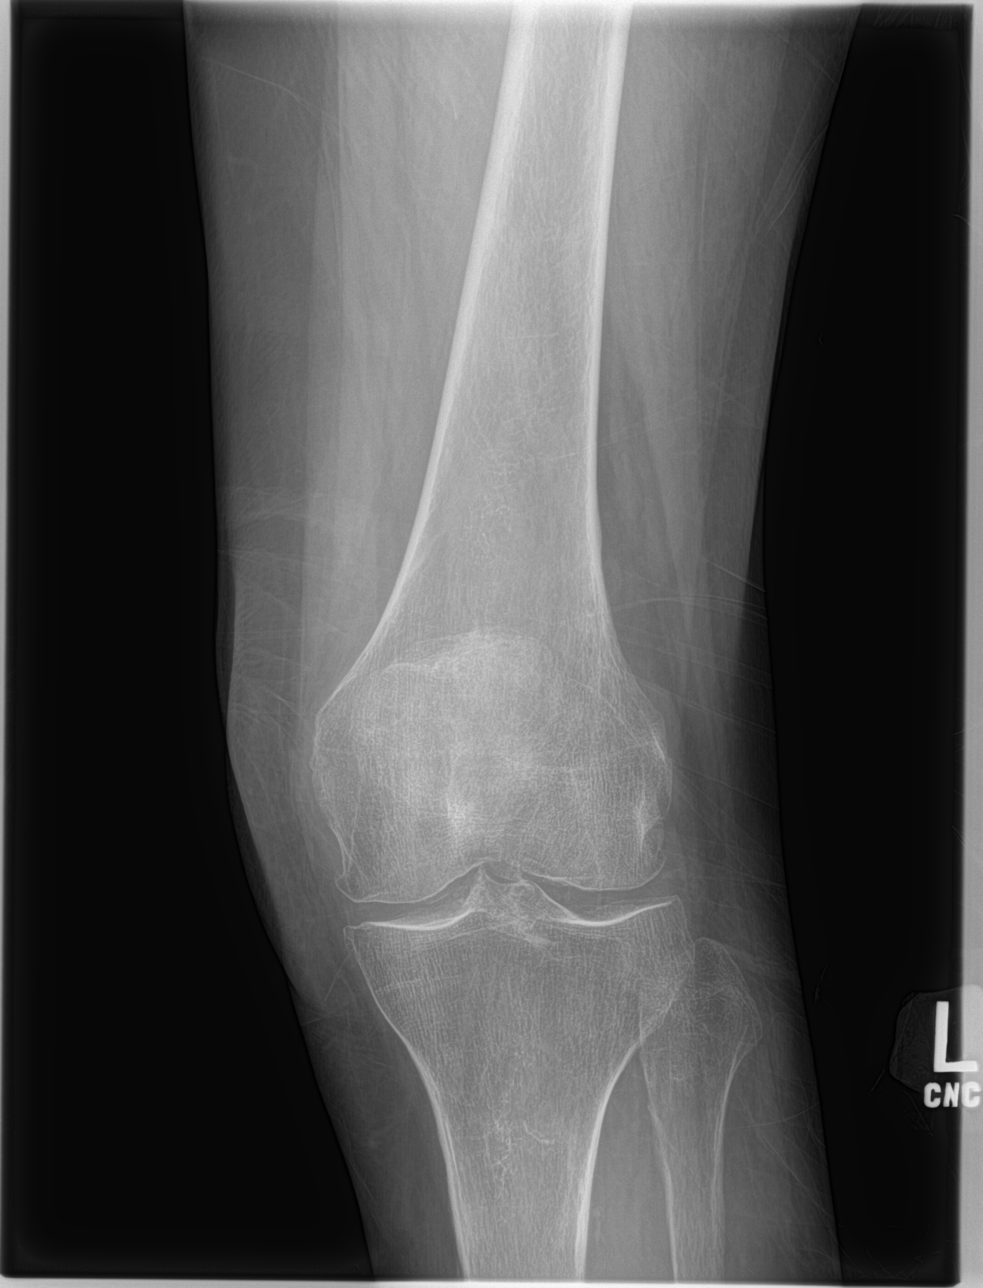
[im 4/4]
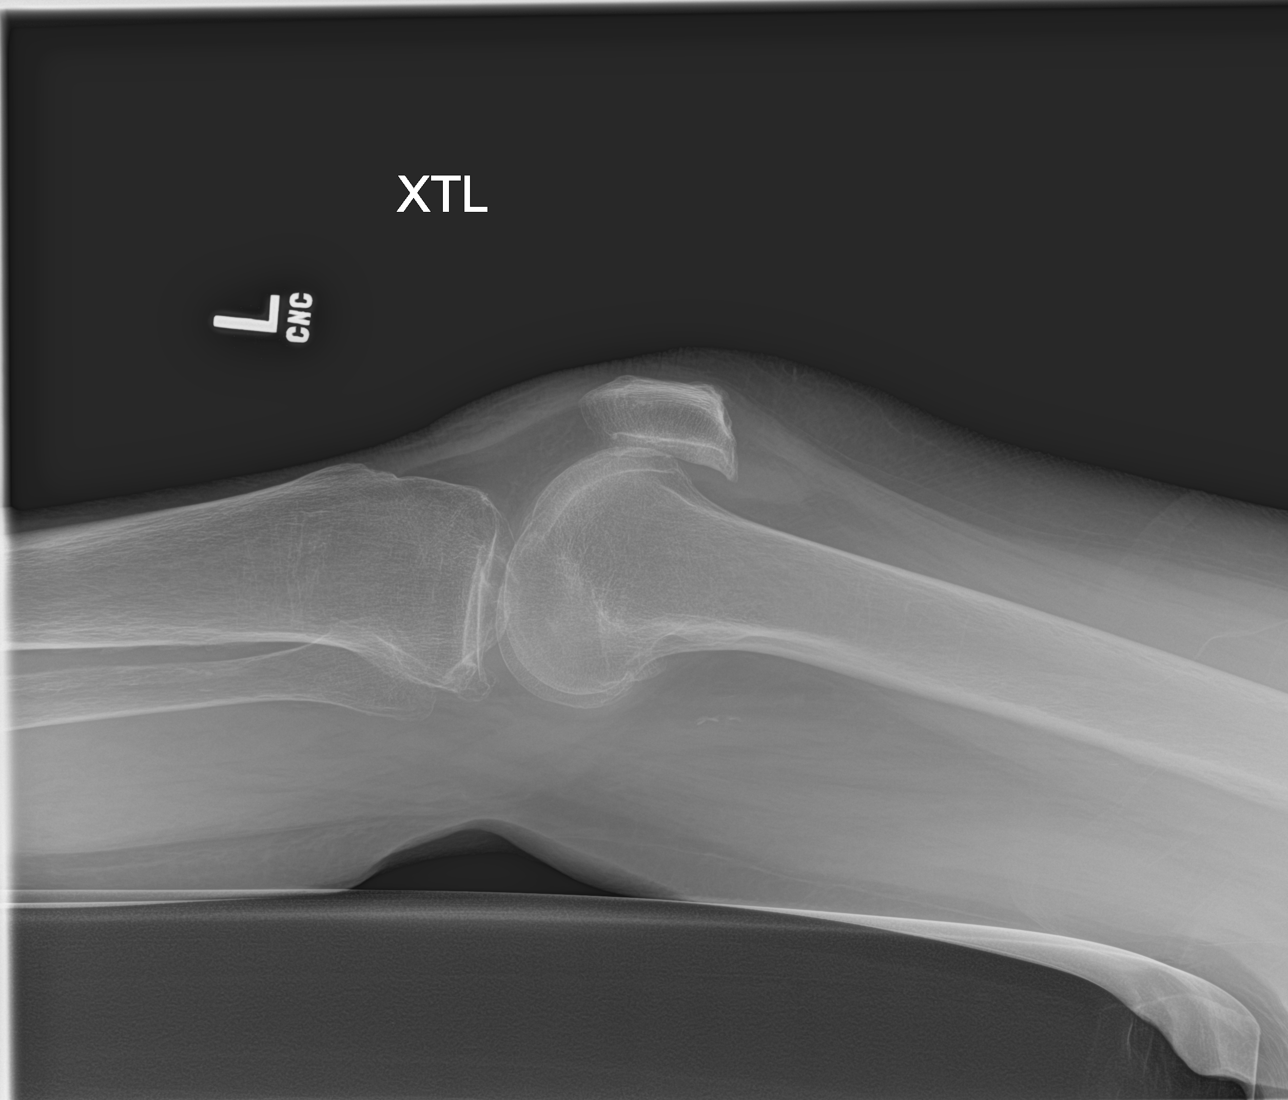

[4 of 4 positions shown; findings below may reference images not displayed]

FINDINGS: No evidence of fracture, dislocation, or large joint effusion. Small
amount of fluid in the suprapatellar recess. No evidence of
arthropathy or other focal bone abnormality. Soft tissues are
unremarkable.
IMPRESSION: Negative.

## 2018-04-25 IMAGING — CT CT MAXILLOFACIAL W/O CM
5 of 12 series · 16 of 47 positions shown, 18 images · non-contrast
Comparison: None.  Head and cervical spine CT scans 09/01/2013.

CLINICAL DATA: Status post fall this morning with a blow to the
back of the head. Bruising about the nose and left eye. Initial
encounter.

EXAM:
CT HEAD WITHOUT CONTRAST
CT MAXILLOFACIAL WITHOUT CONTRAST
CT CERVICAL SPINE WITHOUT CONTRAST
TECHNIQUE: Multidetector CT imaging of the head, cervical spine, and
maxillofacial structures were performed using the standard protocol
without intravenous contrast. Multiplanar CT image reconstructions
of the cervical spine and maxillofacial structures were also
generated.

[Series 3: max soft · axial · 0.33mm/px · z∈[-201,-99]mm · 5 of 77 slices shown]
[im 13/77  brain]
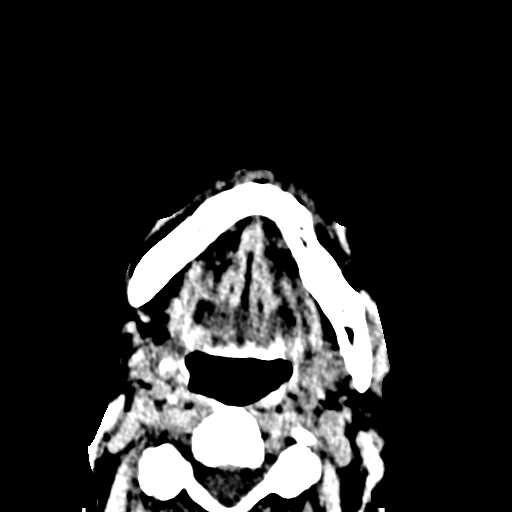
[im 26/77  brain]
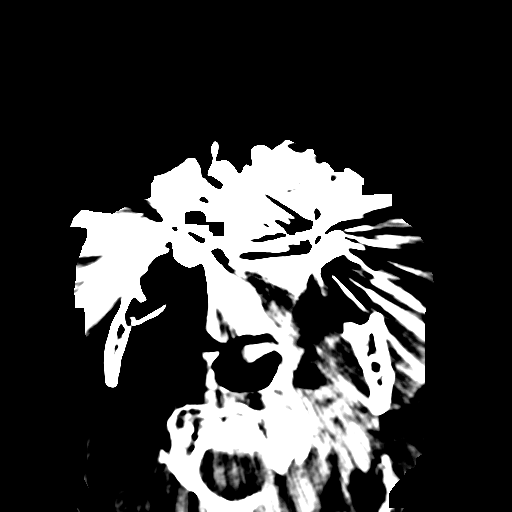
[im 39/77  brain]
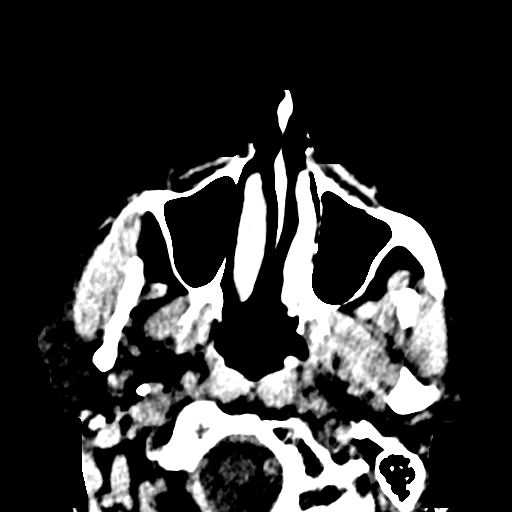
[im 51/77  brain]
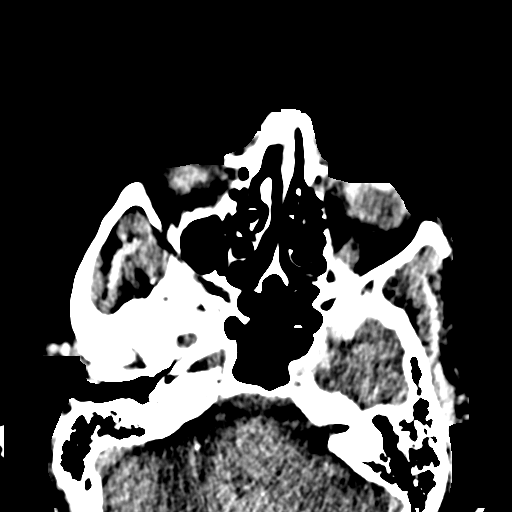
[im 64/77  brain]
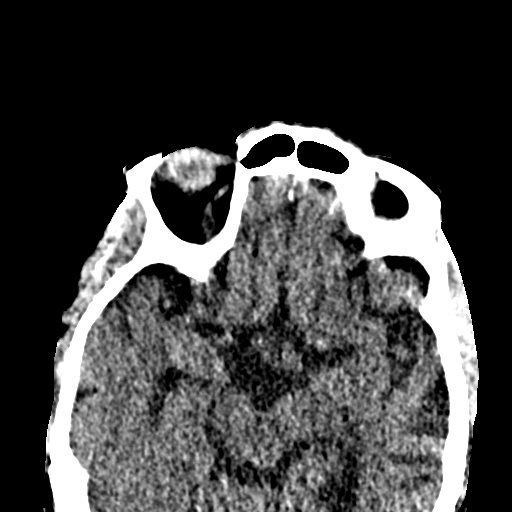

[Series 13: sagittal soft · sagittal · 0.36mm/px · 1 of 75 slices shown]
[im 38/75  bone]
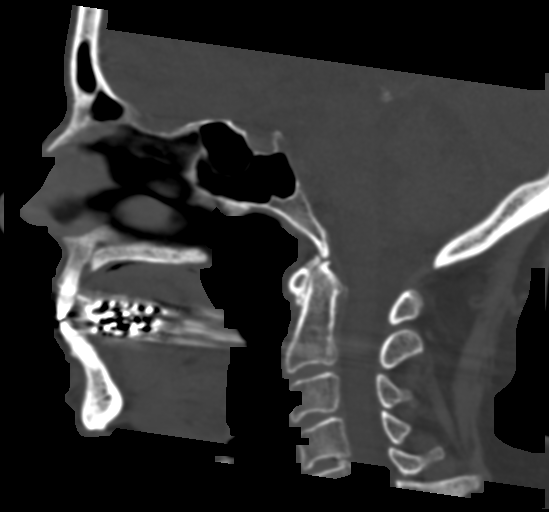

[Series 16: c spine soft · axial · 0.28mm/px · z∈[-263,-185]mm · 4 of 78 slices shown]
[im 13/78  brain]
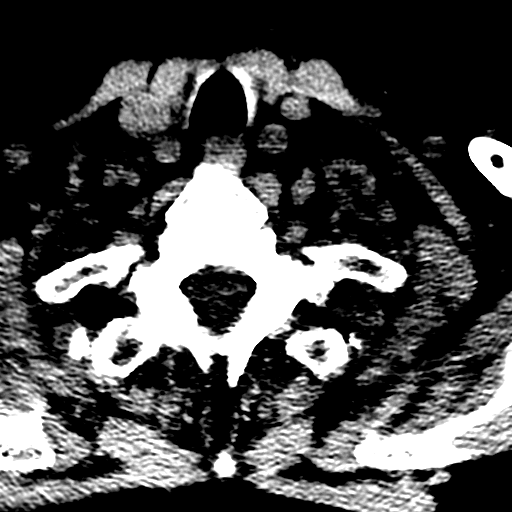
[im 26/78  brain]
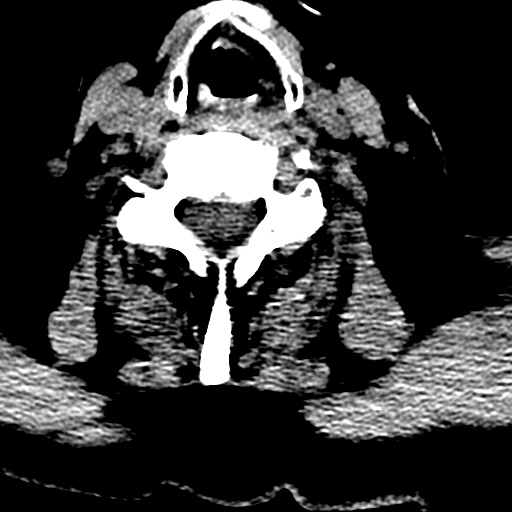
[im 39/78  brain]
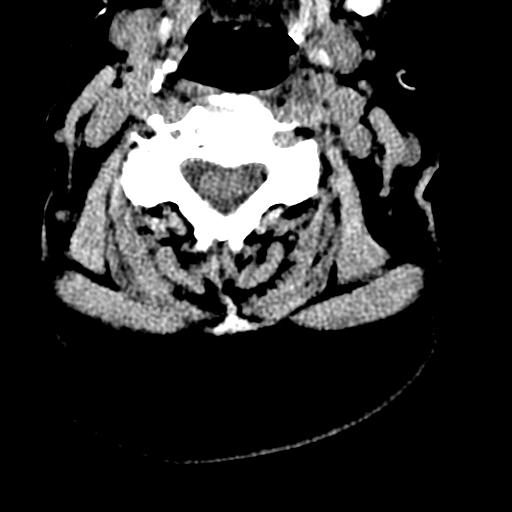
[im 52/78  brain]
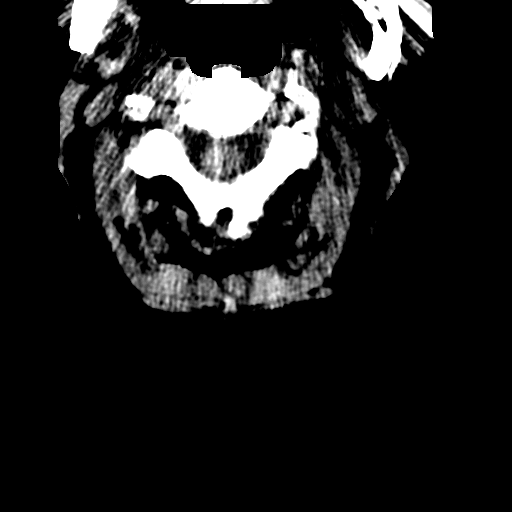

[Series 19: coronal bone · coronal · 0.23mm/px · 1 of 38 slices shown]
[im 19/38  bone]
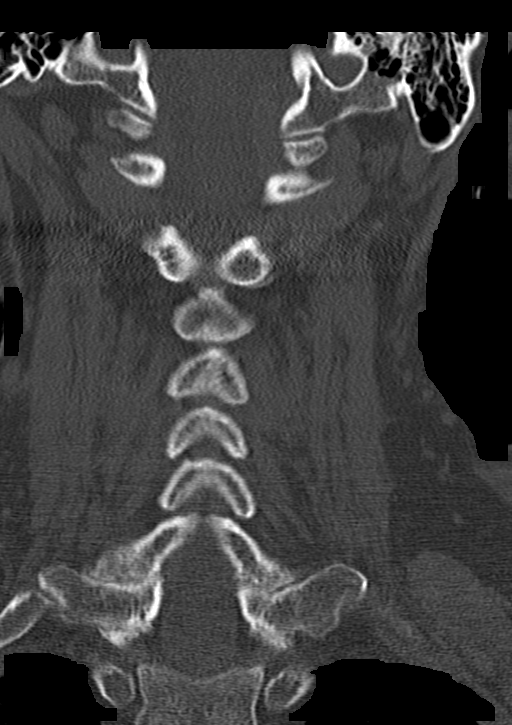

[Series 20: orthogonal axials · axial · 0.17mm/px · z∈[-276,-171]mm · 5 of 86 slices shown, 7 images]
[im 15/86  brain]
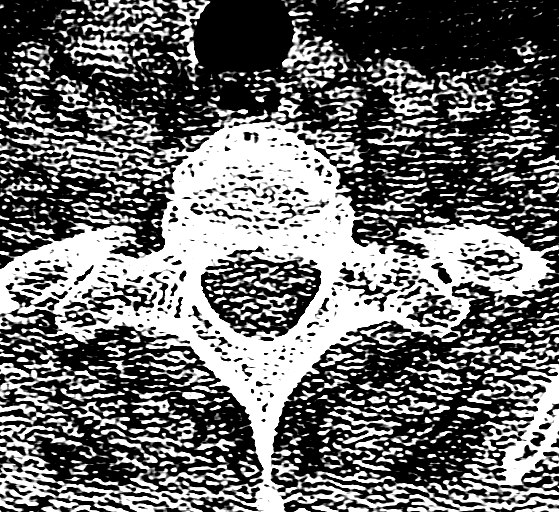
[im 15/86  bone]
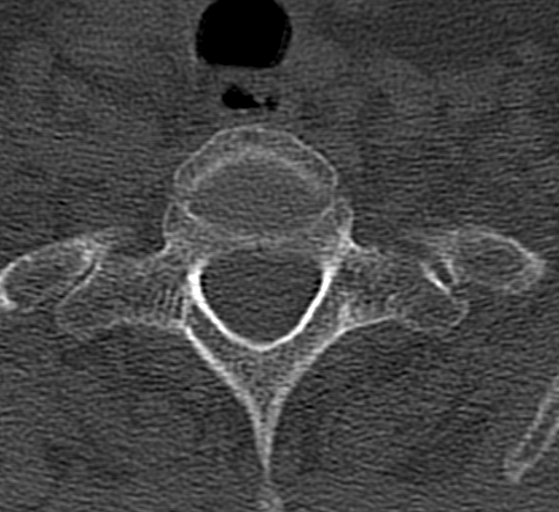
[im 29/86  bone]
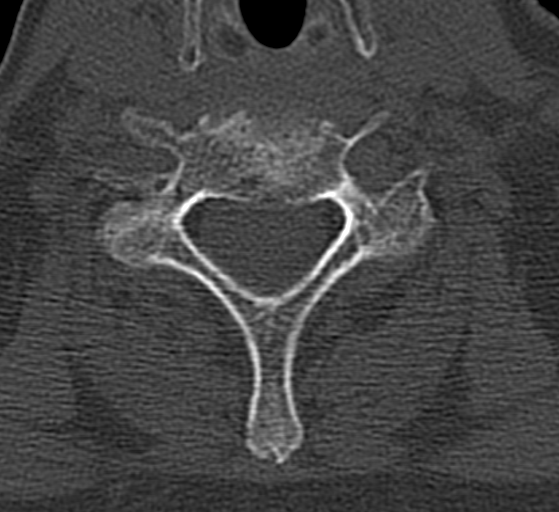
[im 43/86  bone]
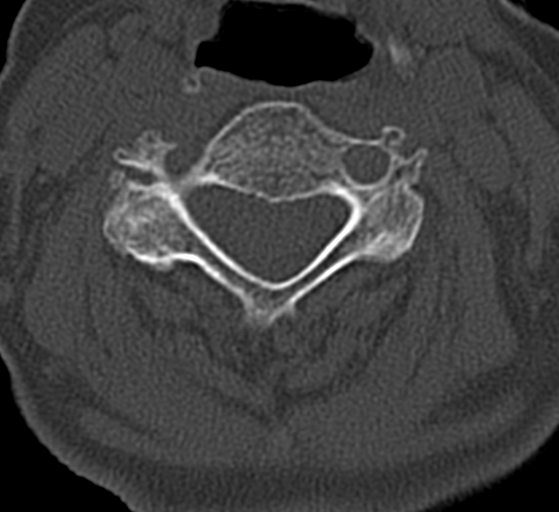
[im 57/86  bone]
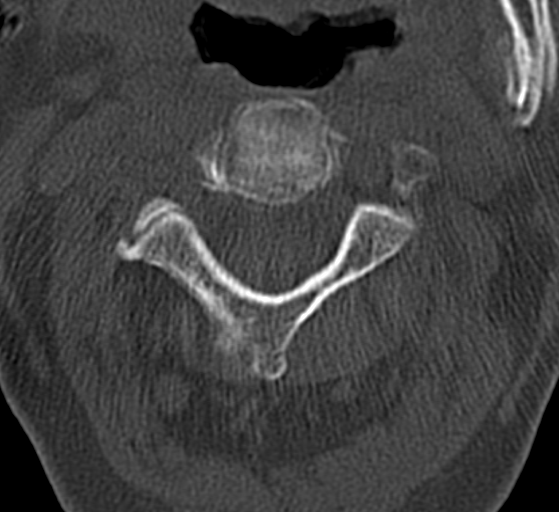
[im 71/86  brain]
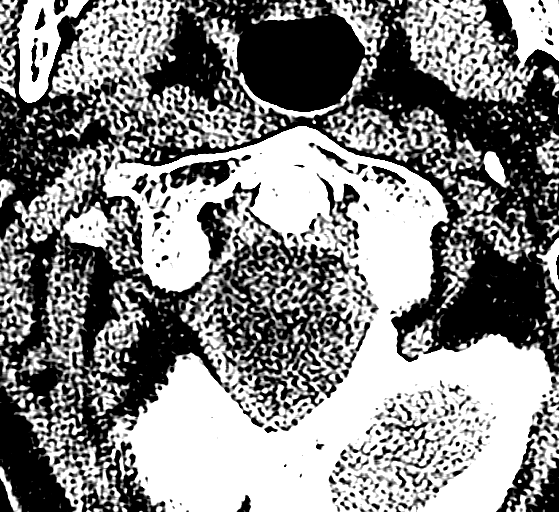
[im 71/86  bone]
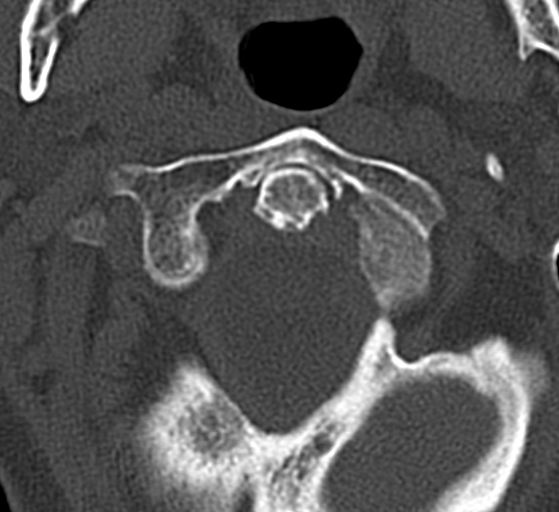

[16 of 47 positions shown; findings below may reference images not displayed]

FINDINGS: CT HEAD FINDINGS

Atrophy and chronic microvascular ischemic change are identified. No
evidence of acute intracranial abnormality including hemorrhage,
infarct, mass lesion, mass effect, midline shift or abnormal
extra-axial fluid collection. No hydrocephalus or pneumocephalus.
The calvarium is intact.

CT MAXILLOFACIAL FINDINGS

No facial bone fracture is identified. The globes are intact and the
lenses are located. Orbital fat is clear. The mandibular condyles
are located. Paranasal sinuses and mastoid air cells are clear.

CT CERVICAL SPINE FINDINGS

No cervical spine fracture or malalignment is identified. Scattered
facet degenerative change is noted. There is some loss of disc space
height at C5-6 and C6-7. Lung apices are clear.
IMPRESSION: No acute abnormality head, face or cervical spine.

Atrophy and chronic microvascular ischemic change.

Mild cervical spondylosis.

## 2018-04-26 IMAGING — DX DG CHEST 1V
1 series · 1 of 1 positions shown · non-contrast
Comparison: 11/15/2015

CLINICAL DATA: Pneumonia

EXAM:
CHEST 1 VIEW

[chest ap]
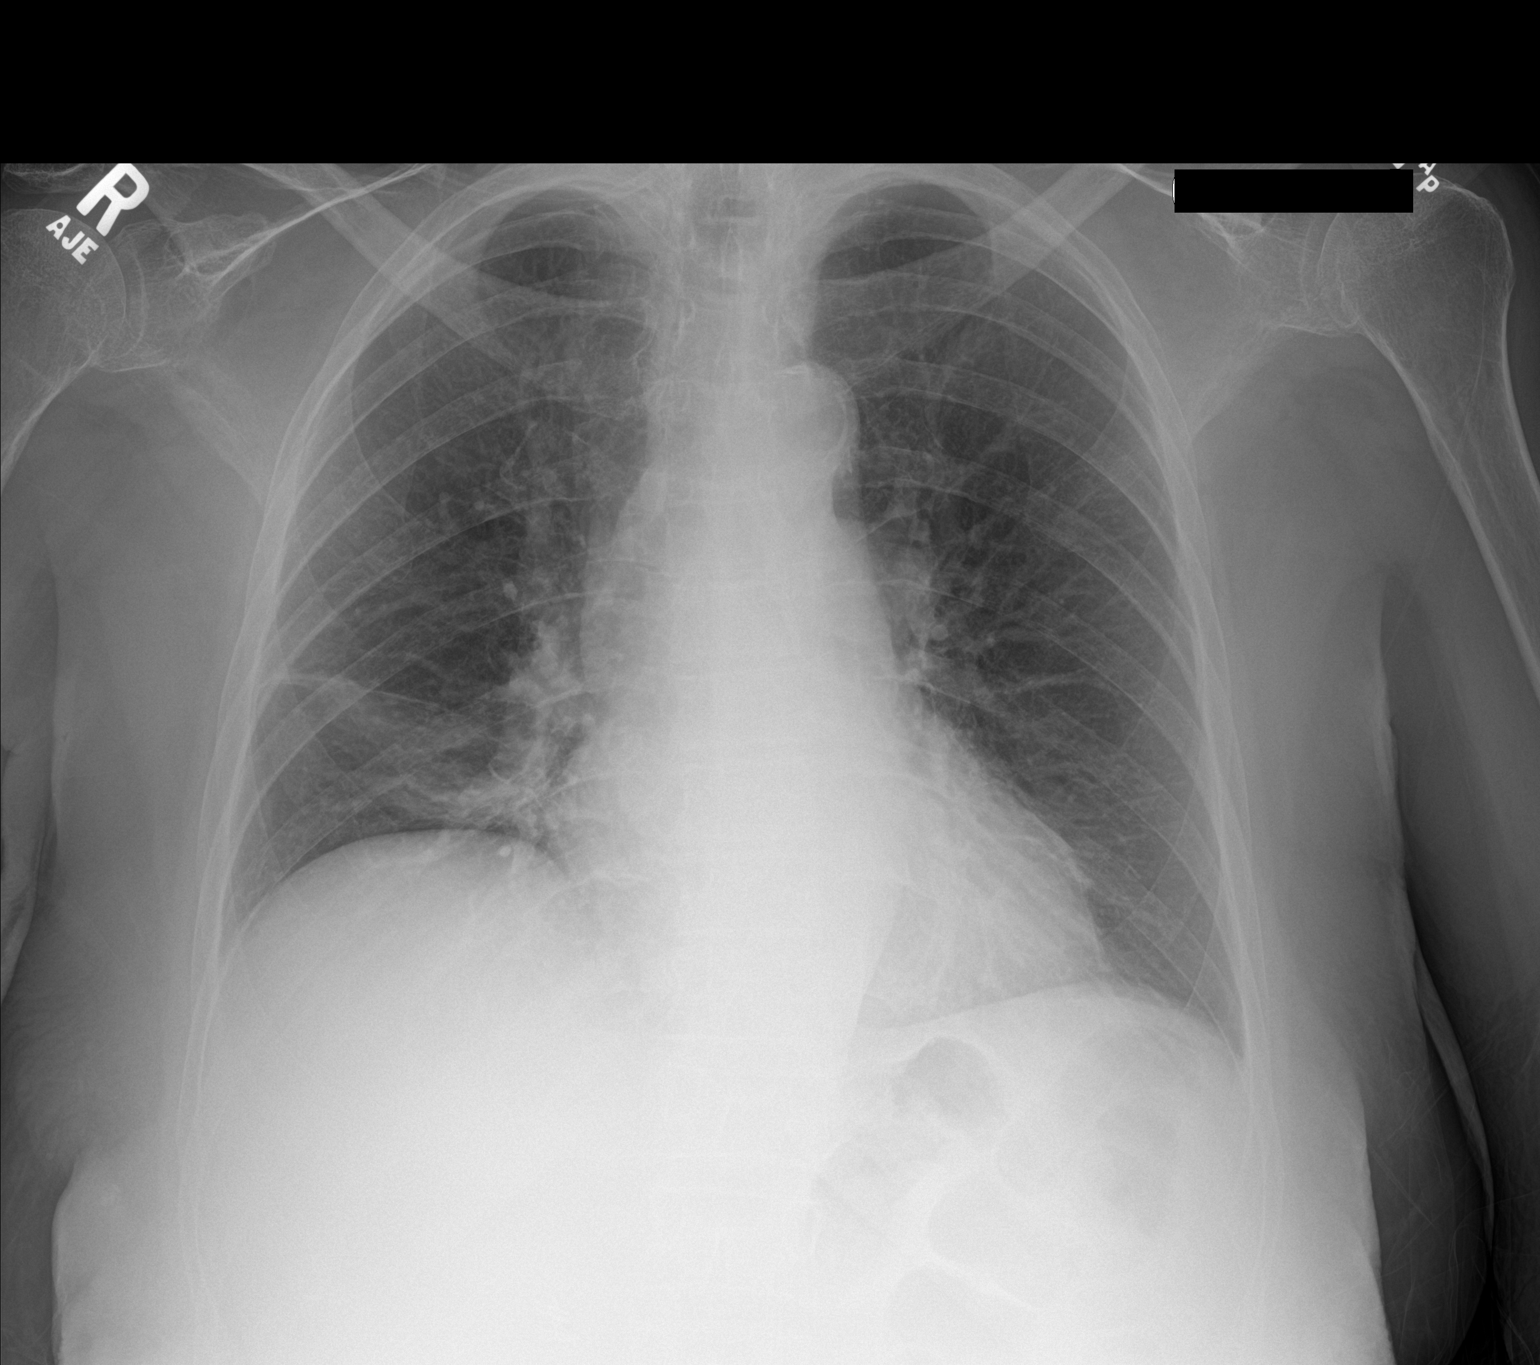

[1 of 1 positions shown; findings below may reference images not displayed]

FINDINGS: Right basilar airspace opacity again noted, similar to prior study.
Previously seen opacity in the left base has improved, nearly
completely resolved. No visible effusions. Heart is borderline in
size. Atherosclerotic calcifications in the aortic arch.
IMPRESSION: Stable right basilar atelectasis or infiltrate. Near complete
resolution of the left lower lobe opacity.

Aortic atherosclerosis.

## 2022-11-07 ENCOUNTER — Emergency Department (HOSPITAL_COMMUNITY): Payer: Medicare Other

## 2022-11-07 ENCOUNTER — Other Ambulatory Visit: Payer: Self-pay

## 2022-11-07 ENCOUNTER — Emergency Department (HOSPITAL_COMMUNITY)
Admission: EM | Admit: 2022-11-07 | Discharge: 2022-11-07 | Disposition: A | Discharge status: Skilled Nursing Facility | Payer: Medicare Other | Attending: Student | Admitting: Student

## 2022-11-07 DIAGNOSIS — S0083XA Contusion of other part of head, initial encounter: Secondary | ICD-10-CM | POA: Insufficient documentation

## 2022-11-07 DIAGNOSIS — M25551 Pain in right hip: Secondary | ICD-10-CM | POA: Diagnosis not present

## 2022-11-07 DIAGNOSIS — W19XXXA Unspecified fall, initial encounter: Secondary | ICD-10-CM

## 2022-11-07 DIAGNOSIS — W1839XA Other fall on same level, initial encounter: Secondary | ICD-10-CM | POA: Insufficient documentation

## 2022-11-07 DIAGNOSIS — B372 Candidiasis of skin and nail: Secondary | ICD-10-CM | POA: Insufficient documentation

## 2022-11-07 DIAGNOSIS — M7918 Myalgia, other site: Secondary | ICD-10-CM

## 2022-11-07 DIAGNOSIS — M791 Myalgia, unspecified site: Secondary | ICD-10-CM | POA: Insufficient documentation

## 2022-11-07 DIAGNOSIS — F0390 Unspecified dementia without behavioral disturbance: Secondary | ICD-10-CM | POA: Diagnosis not present

## 2022-11-07 DIAGNOSIS — S0993XA Unspecified injury of face, initial encounter: Secondary | ICD-10-CM | POA: Diagnosis present

## 2022-11-07 LAB — CBC WITH DIFFERENTIAL/PLATELET
Abs Immature Granulocytes: 0.03 10*3/uL (ref 0.00–0.07)
Basophils Absolute: 0.1 10*3/uL (ref 0.0–0.1)
Basophils Relative: 1 %
Eosinophils Absolute: 0.1 10*3/uL (ref 0.0–0.5)
Eosinophils Relative: 2 %
HCT: 37.9 % (ref 36.0–46.0)
Hemoglobin: 11.8 g/dL — ABNORMAL LOW (ref 12.0–15.0)
Immature Granulocytes: 0 %
Lymphocytes Relative: 13 %
Lymphs Abs: 1.1 10*3/uL (ref 0.7–4.0)
MCH: 28.4 pg (ref 26.0–34.0)
MCHC: 31.1 g/dL (ref 30.0–36.0)
MCV: 91.1 fL (ref 80.0–100.0)
Monocytes Absolute: 0.7 10*3/uL (ref 0.1–1.0)
Monocytes Relative: 9 %
Neutro Abs: 6.3 10*3/uL (ref 1.7–7.7)
Neutrophils Relative %: 75 %
Platelets: 356 10*3/uL (ref 150–400)
RBC: 4.16 MIL/uL (ref 3.87–5.11)
RDW: 13.9 % (ref 11.5–15.5)
WBC: 8.4 10*3/uL (ref 4.0–10.5)
nRBC: 0 % (ref 0.0–0.2)

## 2022-11-07 LAB — URINALYSIS, ROUTINE W REFLEX MICROSCOPIC
Bilirubin Urine: NEGATIVE
Glucose, UA: NEGATIVE mg/dL
Hgb urine dipstick: NEGATIVE
Ketones, ur: NEGATIVE mg/dL
Nitrite: NEGATIVE
Protein, ur: NEGATIVE mg/dL
Specific Gravity, Urine: 1.004 — ABNORMAL LOW (ref 1.005–1.030)
pH: 7 (ref 5.0–8.0)

## 2022-11-07 LAB — COMPREHENSIVE METABOLIC PANEL
ALT: 11 U/L (ref 0–44)
AST: 13 U/L — ABNORMAL LOW (ref 15–41)
Albumin: 2.7 g/dL — ABNORMAL LOW (ref 3.5–5.0)
Alkaline Phosphatase: 74 U/L (ref 38–126)
Anion gap: 8 (ref 5–15)
BUN: 9 mg/dL (ref 8–23)
CO2: 21 mmol/L — ABNORMAL LOW (ref 22–32)
Calcium: 8.3 mg/dL — ABNORMAL LOW (ref 8.9–10.3)
Chloride: 106 mmol/L (ref 98–111)
Creatinine, Ser: 0.76 mg/dL (ref 0.44–1.00)
GFR, Estimated: >60 mL/min (ref >60)
Glucose, Bld: 112 mg/dL — ABNORMAL HIGH (ref 70–99)
Potassium: 4.2 mmol/L (ref 3.5–5.1)
Sodium: 135 mmol/L (ref 135–145)
Total Bilirubin: 0.1 mg/dL — ABNORMAL LOW (ref 0.3–1.2)
Total Protein: 5.9 g/dL — ABNORMAL LOW (ref 6.5–8.1)

## 2022-11-07 MED ORDER — NYSTATIN 100000 UNIT/GM EX POWD
1.0000 | Freq: Three times a day (TID) | CUTANEOUS | 0 refills | Status: AC
Start: 2022-11-07 — End: ?

## 2022-11-07 NOTE — ED Notes (Signed)
[facility] called by [facility]

## 2022-11-07 NOTE — ED Triage Notes (Signed)
Pt BIB [facility] from [facility] d/t a fall. Pt has Hematoma to right eye and c/o of pain to right hip to staff.

## 2022-11-07 NOTE — ED Provider Notes (Signed)
[place] [facility]PARTMENT AT [facility] Provider Note   CSN: [id number] Arrival date & time: 11/07/22  0232     History  Chief Complaint  Patient presents with   Virginia Osborne is a 89 y.o. female.  Virginia SYLER is a 89 y.o. female who presents to the ED from a rehab center via EMS with chief complaint of a fall. Level 5 caveat due to dementia and history obtained from triage note. Patient has a hematoma above her right eye and c/o of pain to right hip to staff.     Fall       Home Medications Prior to Admission medications   Medication Sig Start Date End Date Taking? Authorizing Provider  nystatin (MYCOSTATIN/NYSTOP) powder Apply 1 Application topically 3 (three) times daily. 11/07/22  Yes Antony Madura, PA-C  acetaminophen (TYLENOL) 325 MG tablet Take 650 mg by mouth every 4 (four) hours as needed for mild pain.    [provider]  cholecalciferol (VITAMIN D) 1000 units tablet Take 1 tablet (1,000 Units total) by mouth daily. 04/17/16   Ngetich, Dinah C, NP  levothyroxine (SYNTHROID, LEVOTHROID) 100 MCG tablet Take 100 mcg by mouth daily before breakfast.    [provider]  magnesium hydroxide (MILK OF MAGNESIA) 400 MG/5ML suspension Take 30 mLs by mouth daily as needed for mild constipation. 11/16/15   Adrian Saran, MD  Melatonin 3 MG TABS Take 1 tablet (3 mg total) by mouth at bedtime. 12/19/15   Ngetich, Dinah C, NP  OXYGEN Inhale 2 L into the lungs as needed. As needed to maintain o2 saturation above 90%    [provider]  PARoxetine (PAXIL) 20 MG tablet Take 20 mg by mouth daily.     [provider]  POLYETHYLENE GLYCOL 3350 PO Take 17 g by mouth daily.     [provider]  Propylene Glycol (SYSTANE BALANCE) 0.6 % SOLN Apply 1 drop to eye 2 (two) times daily.    [provider]  Sennosides-Docusate Sodium (SENNA S PO) Take 2 tablets by mouth daily as needed.     [provider]   traZODone (DESYREL) 50 MG tablet Take 50 mg by mouth at bedtime. Start on 08/06/16    [provider]  vitamin B-12 (CYANOCOBALAMIN) 1000 MCG tablet Take 1 tablet (1,000 mcg total) by mouth daily. 04/17/16   Ngetich, Dinah C, NP      Allergies    Erythromycin    Review of Systems   Review of Systems  Unable to perform ROS: Dementia    Physical Exam Updated Vital Signs BP (!) 148/51   Pulse 71   Temp 98.3 F (36.8 C) (Oral)   Resp 17   Ht 5\' 5"  (1.651 m)   Wt 70.9 kg   SpO2 97%   BMI 26.01 kg/m   Physical Exam Vitals and nursing note reviewed.  Constitutional:      General: She is not in acute distress.    Appearance: She is well-developed. She is not diaphoretic.  HENT:     Head: Normocephalic.     Comments: Contusion to the R forehead and lateral to the R eye. Eyes:     General: No scleral icterus.    Extraocular Movements: EOM normal.     Conjunctiva/sclera: Conjunctivae normal.  Cardiovascular:     Rate and Rhythm: Normal rate and regular rhythm.     Pulses: Normal pulses.  Pulmonary:  Effort: Pulmonary effort is normal. No respiratory distress.     Comments: Respirations even and unlabored Musculoskeletal:        General: Normal range of motion.     Cervical back: Normal range of motion.     Comments: No leg shortening or malrotation. No bony deformity or crepitus to BLE.  Skin:    General: Skin is warm and dry.     Coloration: Skin is not pale.     Findings: Rash (candidal intertrigo, breast and groin) present. No erythema.  Neurological:     Mental Status: She is alert and oriented to person, place, and time.     Coordination: Coordination normal.     Comments: Moving extremities spontaneously.  Psychiatric:        Mood and Affect: Mood and affect normal.        Behavior: Behavior normal.     ED Results / Procedures / Treatments   Labs (all labs ordered are listed, but only abnormal results are displayed) Labs Reviewed  COMPREHENSIVE  METABOLIC PANEL - Abnormal; Notable for the following components:      Result Value   CO2 21 (*)    Glucose, Bld 112 (*)    Calcium 8.3 (*)    Total Protein 5.9 (*)    Albumin 2.7 (*)    AST 13 (*)    Total Bilirubin 0.1 (*)    All other components within normal limits  URINALYSIS, ROUTINE W REFLEX MICROSCOPIC - Abnormal; Notable for the following components:   Color, Urine STRAW (*)    Specific Gravity, Urine 1.004 (*)    Leukocytes,Ua TRACE (*)    Bacteria, UA RARE (*)    All other components within normal limits  CBC WITH DIFFERENTIAL/PLATELET - Abnormal; Notable for the following components:   Hemoglobin 11.8 (*)    All other components within normal limits  CBC WITH DIFFERENTIAL/PLATELET    EKG None  Radiology CT CERVICAL SPINE WO CONTRAST  Result Date: 11/07/2022 CLINICAL DATA:  89 year old female status post fall at rehab. Right eye hematoma. Right hip pain. EXAM: CT CERVICAL SPINE WITHOUT CONTRAST TECHNIQUE: Multidetector CT imaging of the cervical spine was performed without intravenous contrast. Multiplanar CT image reconstructions were also generated. RADIATION DOSE REDUCTION: This exam was performed according to the departmental dose-optimization program which includes automated exposure control, adjustment of the mA and/or kV according to patient size and/or use of iterative reconstruction technique. COMPARISON:  Head CT today.  Cervical spine CT 09/01/2013. FINDINGS: Alignment: Relatively stable cervical lordosis since 2015. Cervicothoracic junction alignment is within normal limits. Bilateral posterior element alignment is within normal limits. Skull base and vertebrae: Osteopenia. Visualized skull base is intact. No atlanto-occipital dissociation. C1 and C2 appear intact and aligned. No acute osseous abnormality identified. Soft tissues and spinal canal: No prevertebral fluid or swelling. No visible canal hematoma. Negative visible noncontrast neck soft tissues. Disc  levels: Mild for age cervical spine degeneration, minimally progressed since 2015. Mild associated spinal stenosis is possible at C3-C4 (vacuum disc and ligament flavum hypertrophy at that level. Upper chest: Mild respiratory motion in the lung apices. Grossly intact visible upper thoracic levels. IMPRESSION: 1. No acute traumatic injury identified in the cervical spine. 2. Mild for age cervical spine degeneration. Electronically Signed   By: Odessa Fleming M.D.   On: 11/07/2022 04:17   CT HEAD WO CONTRAST ( )  Result Date: 11/07/2022 CLINICAL DATA:  89 year old female status post fall at rehab. Right eye hematoma. Right hip pain.  EXAM: CT HEAD WITHOUT CONTRAST TECHNIQUE: Contiguous axial images were obtained from the base of the skull through the vertex without intravenous contrast. RADIATION DOSE REDUCTION: This exam was performed according to the departmental dose-optimization program which includes automated exposure control, adjustment of the mA and/or kV according to patient size and/or use of iterative reconstruction technique. COMPARISON:  Head CT 11/15/2015. FINDINGS: Brain: Cerebral volume loss since 2017 appears generalized. No midline shift, ventriculomegaly, mass effect, evidence of mass lesion, intracranial hemorrhage or evidence of cortically based acute infarction. A small area of left occipital pole encephalomalacia is new since 2017 (series 4, image 12). Patchy bilateral cerebral white matter hypodensity is mild to moderate for age and has mildly progressed, including involvement of the anterior limb of the left internal capsule which is more apparent. But otherwise gray-white differentiation is stable. Vascular: No suspicious intracranial vascular hyperdensity. Calcified atherosclerosis at the skull base. Skull: No fracture identified. Sinuses/Orbits: Visualized paranasal sinuses and mastoids are stable and well aerated. Other: Broad-based right lateral periorbital scalp hematoma measuring up to 12  mm in thickness. No scalp soft tissue gas. Globes and intraorbital soft tissues appears stable and intact. No regional fracture is identified. IMPRESSION: 1. Right lateral periorbital scalp hematoma. No underlying fracture identified. 2. No acute intracranial abnormality identified. But Left PCA territory and bilateral cerebral white matter ischemia has progressed since 2017. Electronically Signed   By: Odessa Fleming M.D.   On: 11/07/2022 04:13   DG Ankle Complete Left  Result Date: 11/07/2022 CLINICAL DATA:  Fall EXAM: LEFT ANKLE COMPLETE - 3+ VIEW COMPARISON:  None Available. FINDINGS: No acute bony abnormality. Specifically, no fracture, subluxation, or dislocation. Joint spaces maintained. Soft tissues are intact. IMPRESSION: Negative. Electronically Signed   By: Charlett Nose M.D.   On: 11/07/2022 03:47   DG Hip Unilat W or Wo Pelvis 2-3 Views Right  Result Date: 11/07/2022 CLINICAL DATA:  Fall, hip pain EXAM: DG HIP (WITH OR WITHOUT PELVIS) 2-3V RIGHT COMPARISON:  None Available. FINDINGS: Hardware noted in the right hip related to remote trauma. No acute fracture, subluxation or dislocation. No hardware complicating feature. IMPRESSION: No acute bony abnormality. Electronically Signed   By: Charlett Nose M.D.   On: 11/07/2022 03:14    Procedures Procedures    Medications Ordered in ED Medications - No data to display  ED Course/ Medical Decision Making/ A&P Clinical Course as of 11/07/22 2956  Fri Nov 07, 2022  0422 Xray imaging of hip and L ankle without acute fracture, dislocation, deformity. [KH]    Clinical Course User Index [KH] Antony Madura, PA-C                             Medical Decision Making Amount and/or Complexity of Data Reviewed Labs: ordered. Radiology: ordered.  Risk Prescription drug management.   This patient presents to the ED for concern of unwitnessed fall, this involves an extensive number of treatment options, and is a complaint that carries with it a high  risk of complications and morbidity.  The differential diagnosis includes ICH vs fracture vs concussion vs bony dislocation   Co morbidities that complicate the patient evaluation  Dementia  Thyroid disease   Additional history obtained:  Additional history obtained from EMS upon arrival External records from outside source obtained and reviewed including reassuring head CT in 2017 for comparison   Lab Tests:  I Ordered, and personally interpreted labs.  The pertinent results include:  Hgb 11.8, CO2 21.   Imaging Studies ordered:  I ordered imaging studies including CT head, CT C-spine, Xray R hip and pelvis, Xray L ankle  I independently visualized and interpreted imaging which showed no acute or traumatic pathology I agree with the radiologist interpretation   Cardiac Monitoring:  The patient was maintained on a cardiac monitor.  I personally viewed and interpreted the cardiac monitored which showed an underlying rhythm of: NSR   Medicines ordered and prescription drug management:  I have reviewed the patients home medicines and have made adjustments as needed   Test Considered:  PT/INR   Problem List / ED Course:  Unwitnessed fall at rehab facility.  Noted to have a contusion to the right side of the face, lateral to the right eye.  No Battle sign or raccoon's eyes.  No skull instability.  Moving extremities spontaneously.  Was complaining to facility staff about hip pain, but without leg shortening or malrotation.  Her x-ray imaging does not show evidence of hip fracture.  There is also question of left ankle pain due to tenderness on exam.  X-ray of ankle is also reassuring today.  She is neurovascularly intact in the emergency department. Labs reassuring and at baseline.  Urinalysis without concern for UTI.  She does have evidence of candidal intertrigo.  Will give prescription for nystatin powder. Plan to discharge back to facility via  PTAR.   Reevaluation:  After the interventions noted above, I reevaluated the patient and found that they have :stayed the same   Social Determinants of Health:  Ambulatory with assist device.   Dispostion:  After consideration of the diagnostic results and the patients response to treatment, I feel that the patent would benefit from supportive management, Tylenol PRN. Stable for f/u and recheck with PCP. Return precautions discussed and provided. Patient discharged in stable condition with no unaddressed concerns.          Final Clinical Impression(s) / ED Diagnoses Final diagnoses:  Fall, initial encounter  Contusion of face, initial encounter  Musculoskeletal pain  Candidal intertrigo    Rx / DC Orders ED Discharge Orders          Ordered    nystatin (MYCOSTATIN/NYSTOP) powder  3 times daily        11/07/22 0543              Antony Madura, PA-C 11/07/22 9147    Glendora Score, MD 11/07/22 [phone]

## 2022-11-07 NOTE — Discharge Instructions (Addendum)
Your evaluation in the ED has been reassuring.  You have been prescribed a topical powder to use underneath her breasts and in your groin for management of your rash.  You may take Tylenol as needed for any pain complaints.  Follow-up with a primary care doctor.  Glad she stepped on her orange
